# Patient Record
Sex: Female | Born: 1980 | Race: Black or African American | Hispanic: No | Marital: Single | Smoking: Never smoker
Health system: Southern US, Community
[De-identification: ages and names within clinical notes are randomized; demographics above are authoritative.]

## PROBLEM LIST (undated history)

## (undated) DIAGNOSIS — I10 Essential (primary) hypertension: Secondary | ICD-10-CM

## (undated) DIAGNOSIS — N051 Unspecified nephritic syndrome with focal and segmental glomerular lesions: Secondary | ICD-10-CM

## (undated) DIAGNOSIS — E785 Hyperlipidemia, unspecified: Secondary | ICD-10-CM

## (undated) HISTORY — DX: Hyperlipidemia, unspecified: E78.5

## (undated) HISTORY — DX: Essential (primary) hypertension: I10

## (undated) HISTORY — DX: Morbid (severe) obesity due to excess calories: E66.01

## (undated) HISTORY — DX: Unspecified nephritic syndrome with focal and segmental glomerular lesions: N05.1

---
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
## 1998-06-11 ENCOUNTER — Other Ambulatory Visit: Admission: RE | Admit: 1998-06-11 | Discharge: 1998-06-11 | Payer: Self-pay

## 1998-12-31 ENCOUNTER — Encounter: Admission: RE | Admit: 1998-12-31 | Discharge: 1998-12-31 | Payer: Self-pay | Admitting: Internal Medicine

## 1999-01-05 ENCOUNTER — Encounter: Admission: RE | Admit: 1999-01-05 | Discharge: 1999-01-05 | Payer: Self-pay | Admitting: Hematology and Oncology

## 1999-03-18 ENCOUNTER — Encounter: Admission: RE | Admit: 1999-03-18 | Discharge: 1999-03-18 | Payer: Self-pay | Admitting: Internal Medicine

## 1999-07-25 ENCOUNTER — Encounter: Admission: RE | Admit: 1999-07-25 | Discharge: 1999-07-25 | Payer: Self-pay | Admitting: Internal Medicine

## 1999-10-17 ENCOUNTER — Encounter: Admission: RE | Admit: 1999-10-17 | Discharge: 1999-10-17 | Payer: Self-pay | Admitting: Internal Medicine

## 2000-02-07 ENCOUNTER — Encounter: Admission: RE | Admit: 2000-02-07 | Discharge: 2000-02-07 | Payer: Self-pay | Admitting: Hematology and Oncology

## 2000-04-02 ENCOUNTER — Encounter: Admission: RE | Admit: 2000-04-02 | Discharge: 2000-04-02 | Payer: Self-pay | Admitting: Internal Medicine

## 2000-04-23 ENCOUNTER — Encounter: Admission: RE | Admit: 2000-04-23 | Discharge: 2000-04-23 | Payer: Self-pay | Admitting: Internal Medicine

## 2000-05-04 ENCOUNTER — Encounter: Admission: RE | Admit: 2000-05-04 | Discharge: 2000-08-02 | Payer: Self-pay | Admitting: *Deleted

## 2000-08-14 ENCOUNTER — Encounter: Admission: RE | Admit: 2000-08-14 | Discharge: 2000-08-14 | Payer: Self-pay | Admitting: Hematology and Oncology

## 2000-09-24 ENCOUNTER — Encounter: Admission: RE | Admit: 2000-09-24 | Discharge: 2000-09-24 | Payer: Self-pay | Admitting: Hematology and Oncology

## 2000-09-26 ENCOUNTER — Encounter: Admission: RE | Admit: 2000-09-26 | Discharge: 2000-09-26 | Payer: Self-pay | Admitting: Internal Medicine

## 2000-12-02 ENCOUNTER — Encounter: Payer: Self-pay | Admitting: *Deleted

## 2000-12-02 ENCOUNTER — Emergency Department (HOSPITAL_COMMUNITY): Admission: EM | Admit: 2000-12-02 | Discharge: 2000-12-02 | Payer: Self-pay | Admitting: Emergency Medicine

## 2002-01-16 ENCOUNTER — Encounter: Admission: RE | Admit: 2002-01-16 | Discharge: 2002-01-16 | Payer: Self-pay

## 2002-01-20 ENCOUNTER — Encounter: Admission: RE | Admit: 2002-01-20 | Discharge: 2002-01-20 | Payer: Self-pay

## 2002-02-21 ENCOUNTER — Encounter: Admission: RE | Admit: 2002-02-21 | Discharge: 2002-02-21 | Payer: Self-pay | Admitting: Internal Medicine

## 2002-03-10 ENCOUNTER — Encounter: Admission: RE | Admit: 2002-03-10 | Discharge: 2002-06-08 | Payer: Self-pay | Admitting: *Deleted

## 2002-04-17 ENCOUNTER — Encounter: Admission: RE | Admit: 2002-04-17 | Discharge: 2002-04-17 | Payer: Self-pay | Admitting: Internal Medicine

## 2002-08-20 ENCOUNTER — Encounter: Admission: RE | Admit: 2002-08-20 | Discharge: 2002-08-20 | Payer: Self-pay | Admitting: Internal Medicine

## 2002-11-07 ENCOUNTER — Emergency Department (HOSPITAL_COMMUNITY): Admission: EM | Admit: 2002-11-07 | Discharge: 2002-11-07 | Payer: Self-pay | Admitting: Emergency Medicine

## 2003-04-14 ENCOUNTER — Encounter: Admission: RE | Admit: 2003-04-14 | Discharge: 2003-04-14 | Payer: Self-pay | Admitting: Internal Medicine

## 2003-12-23 ENCOUNTER — Encounter: Admission: RE | Admit: 2003-12-23 | Discharge: 2003-12-23 | Payer: Self-pay | Admitting: Internal Medicine

## 2004-01-20 ENCOUNTER — Encounter: Admission: RE | Admit: 2004-01-20 | Discharge: 2004-02-03 | Payer: Self-pay | Admitting: Occupational Medicine

## 2004-02-23 ENCOUNTER — Encounter: Admission: RE | Admit: 2004-02-23 | Discharge: 2004-02-23 | Payer: Self-pay | Admitting: Internal Medicine

## 2004-02-25 ENCOUNTER — Encounter: Admission: RE | Admit: 2004-02-25 | Discharge: 2004-02-25 | Payer: Self-pay | Admitting: Internal Medicine

## 2004-10-03 ENCOUNTER — Ambulatory Visit: Payer: Self-pay | Admitting: Internal Medicine

## 2005-05-17 ENCOUNTER — Ambulatory Visit: Payer: Self-pay | Admitting: Internal Medicine

## 2005-08-11 ENCOUNTER — Ambulatory Visit: Payer: Self-pay | Admitting: Internal Medicine

## 2006-02-14 ENCOUNTER — Ambulatory Visit: Payer: Self-pay | Admitting: Hospitalist

## 2006-08-22 ENCOUNTER — Ambulatory Visit (HOSPITAL_COMMUNITY): Admission: RE | Admit: 2006-08-22 | Discharge: 2006-08-22 | Payer: Self-pay | Admitting: Internal Medicine

## 2006-08-22 ENCOUNTER — Ambulatory Visit: Payer: Self-pay | Admitting: Internal Medicine

## 2006-08-22 ENCOUNTER — Encounter (INDEPENDENT_AMBULATORY_CARE_PROVIDER_SITE_OTHER): Payer: Self-pay | Admitting: Unknown Physician Specialty

## 2006-08-22 LAB — CONVERTED CEMR LAB
BUN: 11 mg/dL (ref 6–23)
CO2: 26 meq/L (ref 19–32)
Calcium: 9.3 mg/dL (ref 8.4–10.5)
Chloride: 105 meq/L (ref 96–112)
Creatinine, Ser: 0.58 mg/dL (ref 0.40–1.20)
Glucose, Bld: 91 mg/dL (ref 70–99)
Potassium: 3.9 meq/L (ref 3.5–5.3)
Sodium: 140 meq/L (ref 135–145)

## 2006-08-29 ENCOUNTER — Encounter (INDEPENDENT_AMBULATORY_CARE_PROVIDER_SITE_OTHER): Payer: Self-pay | Admitting: Unknown Physician Specialty

## 2006-08-29 ENCOUNTER — Ambulatory Visit: Payer: Self-pay | Admitting: Internal Medicine

## 2006-08-29 LAB — CONVERTED CEMR LAB
Creatinine, Urine: 244.9 mg/dL
Microalb Creat Ratio: 126.2 mg/g — ABNORMAL HIGH (ref 0.0–30.0)
Microalb, Ur: 30.9 mg/dL — ABNORMAL HIGH (ref 0.00–1.89)

## 2006-09-26 ENCOUNTER — Encounter: Admission: RE | Admit: 2006-09-26 | Discharge: 2006-09-26 | Payer: Self-pay | Admitting: Unknown Physician Specialty

## 2006-10-03 ENCOUNTER — Ambulatory Visit: Payer: Self-pay | Admitting: Hospitalist

## 2006-10-25 ENCOUNTER — Ambulatory Visit: Payer: Self-pay | Admitting: *Deleted

## 2006-10-25 DIAGNOSIS — E785 Hyperlipidemia, unspecified: Secondary | ICD-10-CM

## 2006-10-25 DIAGNOSIS — I10 Essential (primary) hypertension: Secondary | ICD-10-CM

## 2006-11-22 ENCOUNTER — Ambulatory Visit: Payer: Self-pay | Admitting: Internal Medicine

## 2007-06-19 ENCOUNTER — Encounter (INDEPENDENT_AMBULATORY_CARE_PROVIDER_SITE_OTHER): Payer: Self-pay | Admitting: *Deleted

## 2007-06-19 ENCOUNTER — Ambulatory Visit: Payer: Self-pay | Admitting: Internal Medicine

## 2007-06-21 LAB — CONVERTED CEMR LAB
ALT: 12 units/L (ref 0–35)
AST: 12 units/L (ref 0–37)
Albumin: 3.8 g/dL (ref 3.5–5.2)
Alkaline Phosphatase: 80 units/L (ref 39–117)
BUN: 9 mg/dL (ref 6–23)
CO2: 27 meq/L (ref 19–32)
Calcium: 9.1 mg/dL (ref 8.4–10.5)
Chloride: 103 meq/L (ref 96–112)
Cholesterol: 195 mg/dL (ref 0–200)
Creatinine, Ser: 0.57 mg/dL (ref 0.40–1.20)
Glucose, Bld: 95 mg/dL (ref 70–99)
HCT: 40.2 % (ref 36.0–46.0)
HDL: 46 mg/dL (ref >39)
Hemoglobin: 12.9 g/dL (ref 12.0–15.0)
LDL Cholesterol: 137 mg/dL — ABNORMAL HIGH (ref 0–99)
MCHC: 32.1 g/dL (ref 30.0–36.0)
MCV: 88.2 fL (ref 78.0–100.0)
Platelets: 370 10*3/uL (ref 150–400)
Potassium: 3.9 meq/L (ref 3.5–5.3)
RBC: 4.56 M/uL (ref 3.87–5.11)
RDW: 14.6 % — ABNORMAL HIGH (ref 11.5–14.0)
Sodium: 140 meq/L (ref 135–145)
Total Bilirubin: 0.5 mg/dL (ref 0.3–1.2)
Total CHOL/HDL Ratio: 4.2
Total Protein: 7.1 g/dL (ref 6.0–8.3)
Triglycerides: 58 mg/dL (ref <150)
VLDL: 12 mg/dL (ref 0–40)
WBC: 5 10*3/uL (ref 4.0–10.5)

## 2007-07-03 ENCOUNTER — Encounter (INDEPENDENT_AMBULATORY_CARE_PROVIDER_SITE_OTHER): Payer: Self-pay | Admitting: *Deleted

## 2007-07-03 ENCOUNTER — Ambulatory Visit: Payer: Self-pay | Admitting: Internal Medicine

## 2007-07-08 LAB — CONVERTED CEMR LAB
Creatinine, Urine: 140.2 mg/dL
Microalb Creat Ratio: 104.9 mg/g — ABNORMAL HIGH (ref 0.0–30.0)
Microalb, Ur: 14.7 mg/dL — ABNORMAL HIGH (ref 0.00–1.89)

## 2007-11-16 IMAGING — CR DG THORACIC SPINE 2V
4 series · 4 of 4 positions shown · non-contrast
Comparison: none

CLINICAL DATA: Injured back on job lifting patient. 
 THORACIC SPINE ? 2 VIEW:

[view not recorded (1 of 4)]
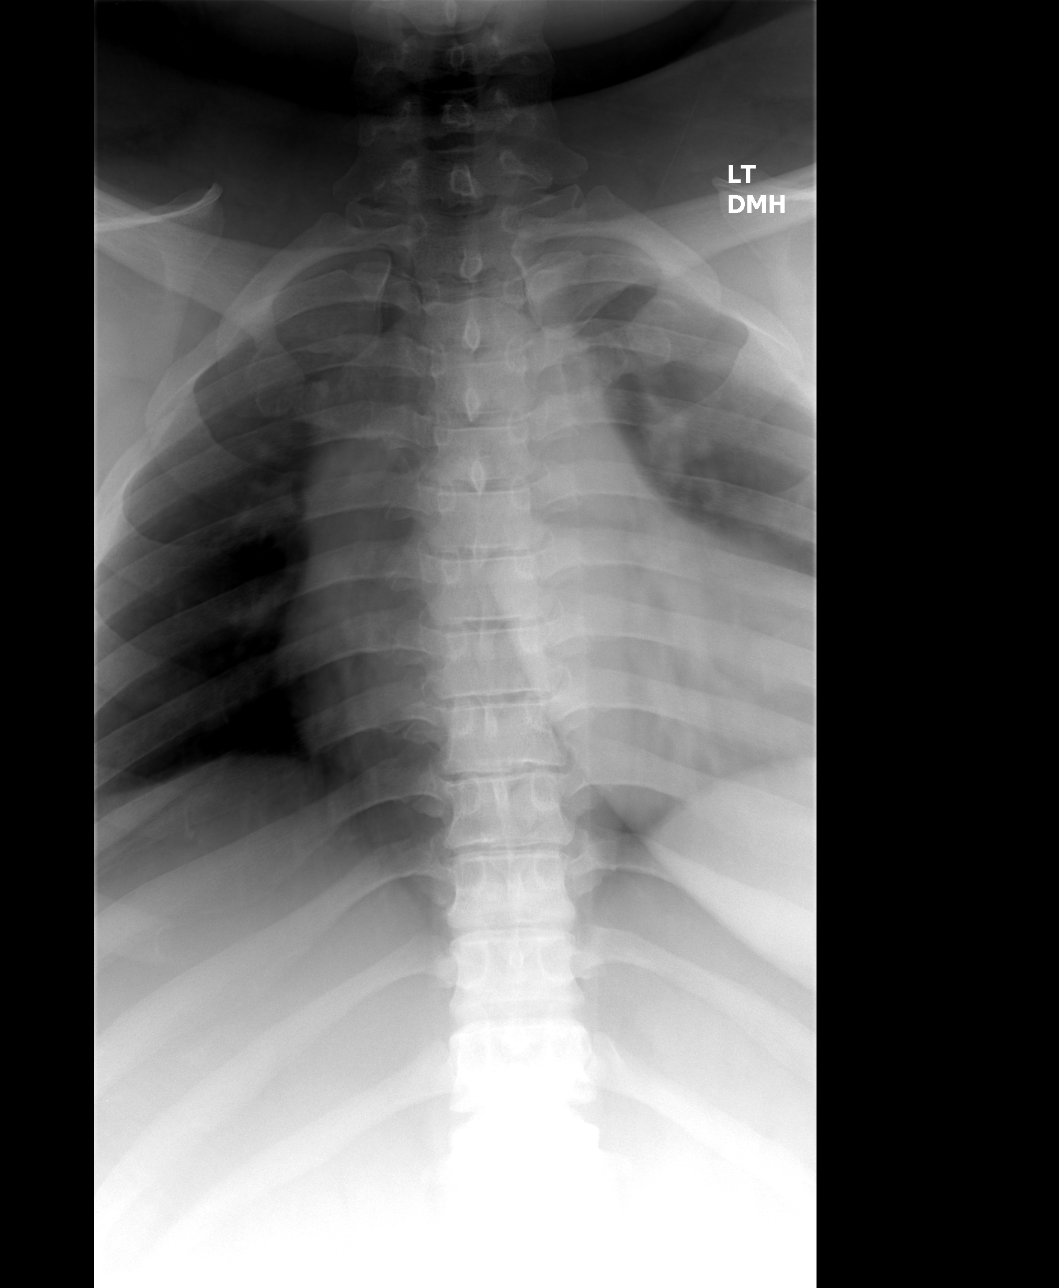

[view not recorded (2 of 4)]
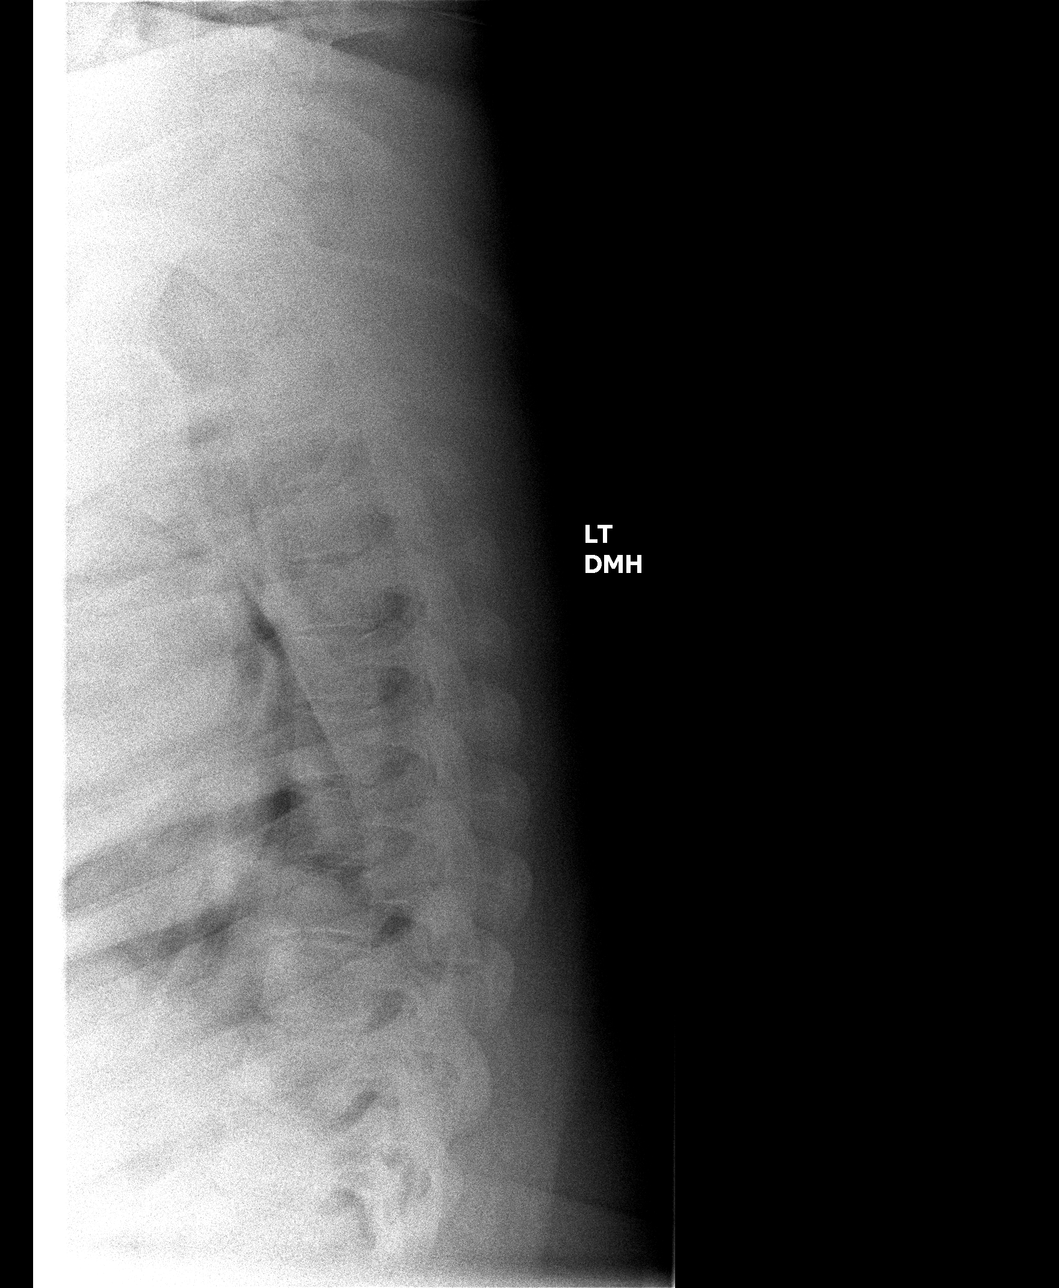

[view not recorded (3 of 4)]
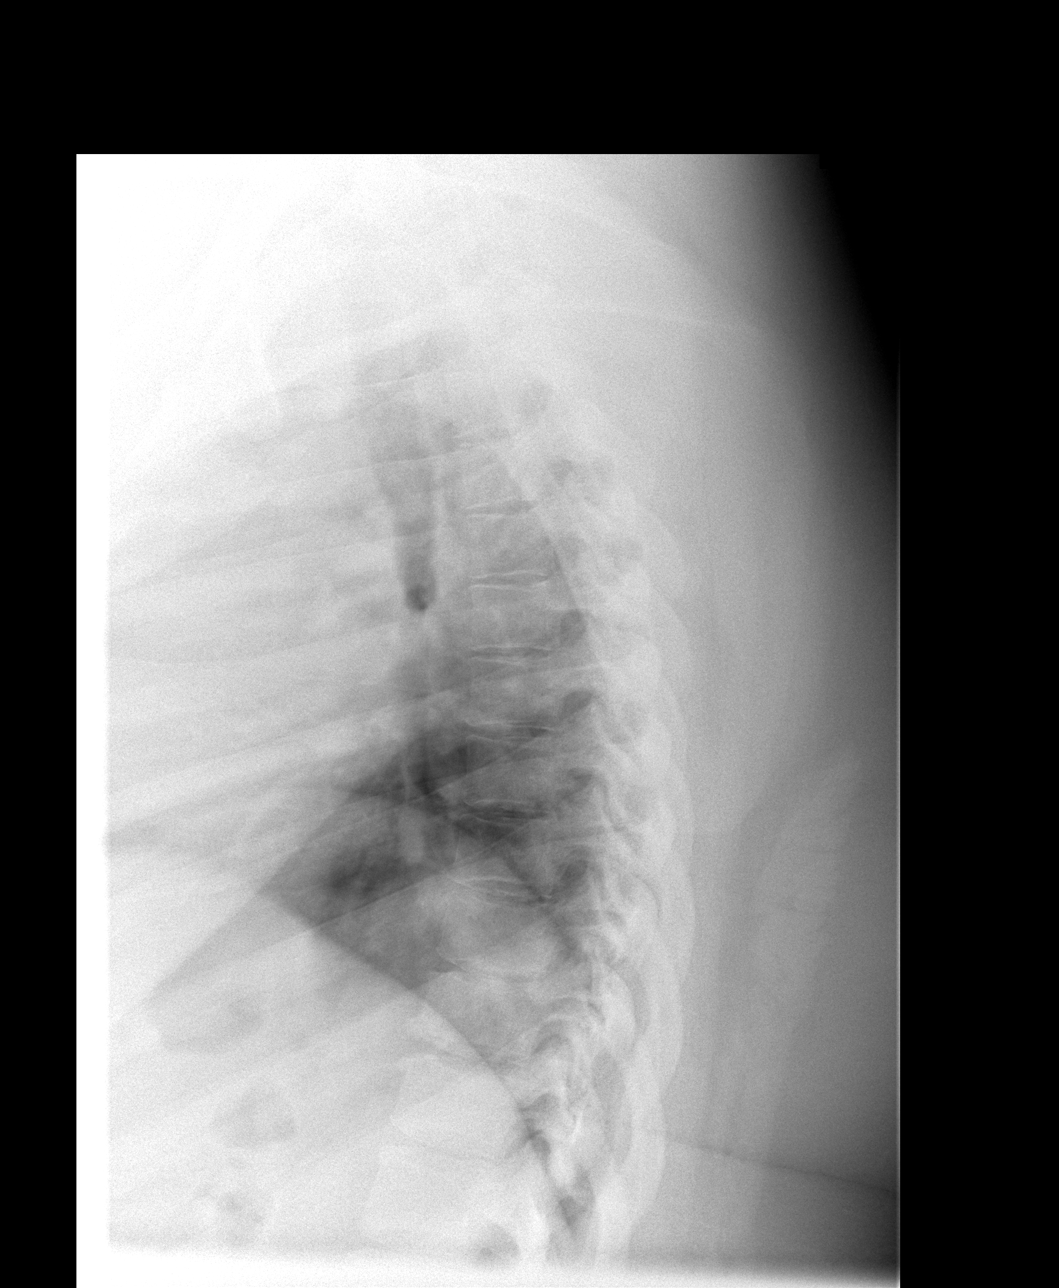

[view not recorded (4 of 4)]
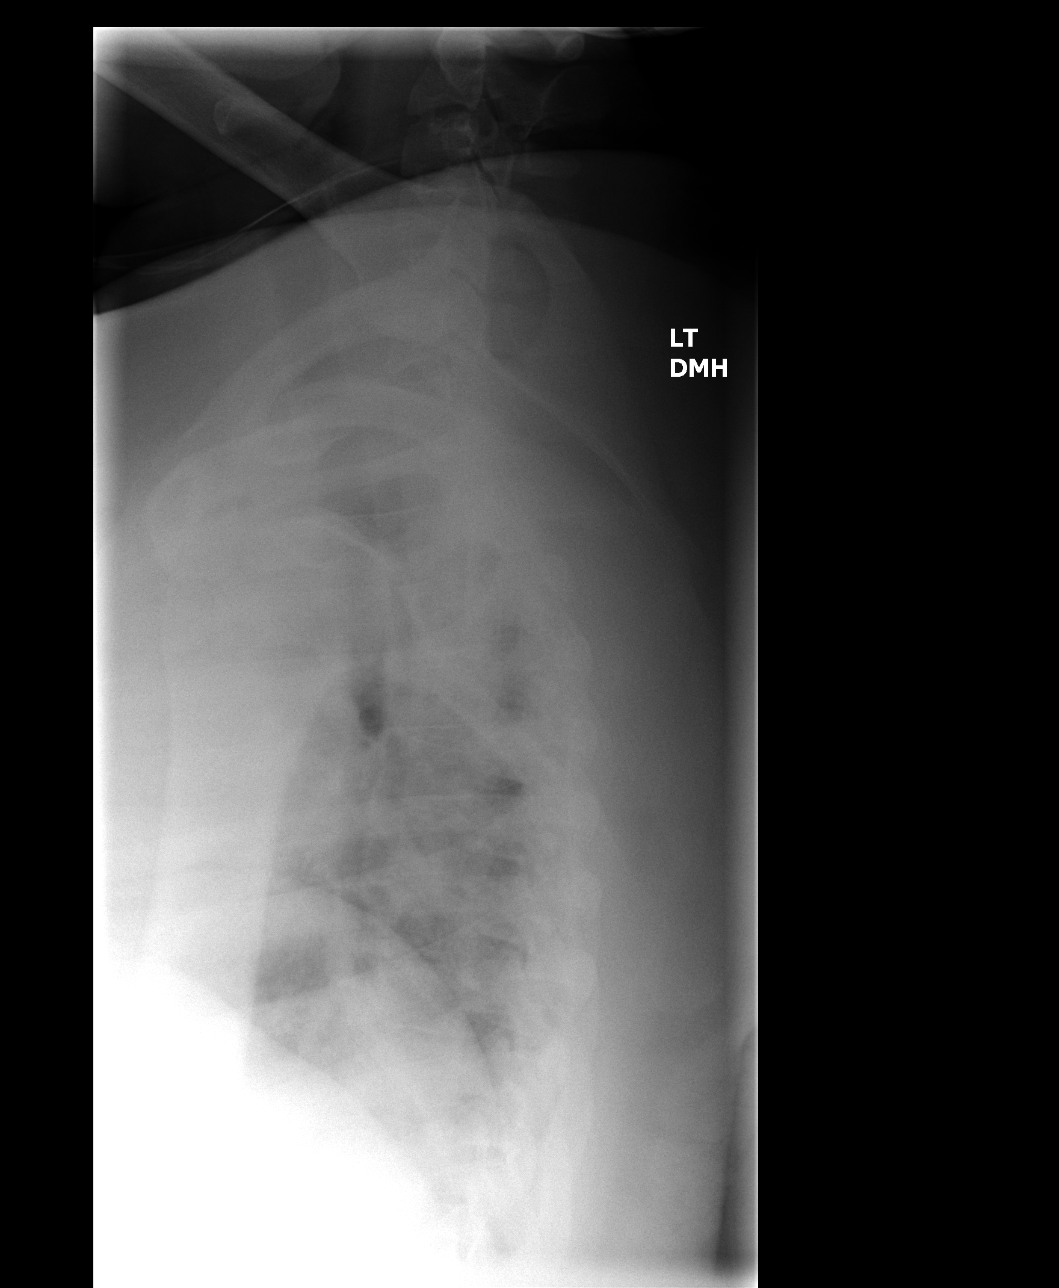

[4 of 4 positions shown; findings below may reference images not displayed]

FINDINGS: The cervicothoracic junction, despite the swimmer?s view is not well penetrated.  As a result, this area is not well visualized on the lateral projection.  There is no evidence for fracture or subluxation.    There is no evidence for paraspinal soft tissue swelling.
IMPRESSION: Negative study with suboptimal visualization of the upper thoracic spine and cervicothoracic junction in the lateral projection.

## 2008-01-14 ENCOUNTER — Telehealth: Payer: Self-pay | Admitting: *Deleted

## 2008-01-17 ENCOUNTER — Ambulatory Visit: Payer: Self-pay | Admitting: Hospitalist

## 2008-01-17 ENCOUNTER — Encounter (INDEPENDENT_AMBULATORY_CARE_PROVIDER_SITE_OTHER): Payer: Self-pay | Admitting: Internal Medicine

## 2008-01-17 LAB — CONVERTED CEMR LAB
BUN: 7 mg/dL (ref 6–23)
CO2: 26 meq/L (ref 19–32)
Calcium: 9.2 mg/dL (ref 8.4–10.5)
Chloride: 106 meq/L (ref 96–112)
Creatinine, Ser: 0.53 mg/dL (ref 0.40–1.20)
Creatinine, Urine: 140.3 mg/dL
Glucose, Bld: 92 mg/dL (ref 70–99)
Microalb Creat Ratio: 68.8 mg/g — ABNORMAL HIGH (ref 0.0–30.0)
Microalb, Ur: 9.65 mg/dL — ABNORMAL HIGH (ref 0.00–1.89)
Potassium: 3.8 meq/L (ref 3.5–5.3)
Sodium: 138 meq/L (ref 135–145)

## 2008-05-27 ENCOUNTER — Encounter (INDEPENDENT_AMBULATORY_CARE_PROVIDER_SITE_OTHER): Payer: Self-pay | Admitting: Internal Medicine

## 2008-05-27 ENCOUNTER — Ambulatory Visit: Payer: Self-pay | Admitting: Internal Medicine

## 2008-06-03 ENCOUNTER — Ambulatory Visit: Payer: Self-pay | Admitting: Internal Medicine

## 2008-06-03 LAB — CONVERTED CEMR LAB
Basophils Absolute: 0 10*3/uL (ref 0.0–0.1)
Basophils Relative: 1 % (ref 0–1)
Eosinophils Absolute: 0.1 10*3/uL (ref 0.0–0.7)
Eosinophils Relative: 2 % (ref 0–5)
HCT: 40.7 % (ref 36.0–46.0)
Hemoglobin: 13.9 g/dL (ref 12.0–15.0)
Lymphocytes Relative: 36 % (ref 12–46)
Lymphs Abs: 1 10*3/uL (ref 0.7–4.0)
MCHC: 34.2 g/dL (ref 30.0–36.0)
MCV: 82.1 fL (ref 78.0–100.0)
Monocytes Absolute: 0.5 10*3/uL (ref 0.1–1.0)
Monocytes Relative: 17 % — ABNORMAL HIGH (ref 3–12)
Neutro Abs: 1.3 10*3/uL — ABNORMAL LOW (ref 1.7–7.7)
Neutrophils Relative %: 44 % (ref 43–77)
Platelets: 365 10*3/uL (ref 150–400)
RBC: 4.96 M/uL (ref 3.87–5.11)
RDW: 14 % (ref 11.5–15.5)
WBC: 2.9 10*3/uL — ABNORMAL LOW (ref 4.0–10.5)

## 2008-06-04 ENCOUNTER — Encounter (INDEPENDENT_AMBULATORY_CARE_PROVIDER_SITE_OTHER): Payer: Self-pay | Admitting: Internal Medicine

## 2008-06-15 ENCOUNTER — Telehealth: Payer: Self-pay | Admitting: *Deleted

## 2008-12-17 ENCOUNTER — Encounter (INDEPENDENT_AMBULATORY_CARE_PROVIDER_SITE_OTHER): Payer: Self-pay | Admitting: Internal Medicine

## 2008-12-17 ENCOUNTER — Ambulatory Visit: Payer: Self-pay | Admitting: *Deleted

## 2008-12-21 LAB — CONVERTED CEMR LAB
ALT: 14 units/L (ref 0–35)
AST: 12 units/L (ref 0–37)
Albumin: 3.7 g/dL (ref 3.5–5.2)
Alkaline Phosphatase: 91 units/L (ref 39–117)
BUN: 11 mg/dL (ref 6–23)
CO2: 24 meq/L (ref 19–32)
Calcium: 9.2 mg/dL (ref 8.4–10.5)
Chloride: 104 meq/L (ref 96–112)
Cholesterol: 187 mg/dL (ref 0–200)
Creatinine, Ser: 0.5 mg/dL (ref 0.40–1.20)
Glucose, Bld: 88 mg/dL (ref 70–99)
HCT: 40 % (ref 36.0–46.0)
HDL: 47 mg/dL (ref >39)
Hemoglobin: 13.2 g/dL (ref 12.0–15.0)
LDL Cholesterol: 128 mg/dL — ABNORMAL HIGH (ref 0–99)
MCHC: 33 g/dL (ref 30.0–36.0)
MCV: 84.6 fL (ref 78.0–100.0)
Platelets: 394 10*3/uL (ref 150–400)
Potassium: 4.2 meq/L (ref 3.5–5.3)
RBC: 4.73 M/uL (ref 3.87–5.11)
RDW: 13.4 % (ref 11.5–15.5)
Sodium: 141 meq/L (ref 135–145)
Total Bilirubin: 0.4 mg/dL (ref 0.3–1.2)
Total CHOL/HDL Ratio: 4
Total Protein: 7.4 g/dL (ref 6.0–8.3)
Triglycerides: 60 mg/dL (ref <150)
VLDL: 12 mg/dL (ref 0–40)
WBC: 5.4 10*3/uL (ref 4.0–10.5)

## 2009-04-05 ENCOUNTER — Telehealth: Payer: Self-pay | Admitting: Infectious Disease

## 2010-02-22 ENCOUNTER — Ambulatory Visit: Payer: Self-pay | Admitting: Internal Medicine

## 2010-02-22 LAB — CONVERTED CEMR LAB
AST: 13 units/L (ref 0–37)
Albumin: 4.2 g/dL (ref 3.5–5.2)
BUN: 12 mg/dL (ref 6–23)
Calcium: 9.2 mg/dL (ref 8.4–10.5)
Casts: NONE SEEN /lpf
Chloride: 105 meq/L (ref 96–112)
Creatinine, Urine: 315 mg/dL
Glucose, Bld: 97 mg/dL (ref 70–99)
HDL: 37 mg/dL — ABNORMAL LOW (ref >39)
Ketones, ur: NEGATIVE mg/dL
Nitrite: NEGATIVE
Potassium: 3.9 meq/L (ref 3.5–5.3)
Specific Gravity, Urine: 1.03 (ref 1.005–1.0)
TSH: 0.922 microintl units/mL (ref 0.350–4.5)
pH: 5.5 (ref 5.0–8.0)

## 2010-05-09 ENCOUNTER — Telehealth: Payer: Self-pay | Admitting: Internal Medicine

## 2010-11-22 ENCOUNTER — Ambulatory Visit: Admit: 2010-11-22 | Payer: Self-pay

## 2010-12-13 NOTE — Assessment & Plan Note (Signed)
Summary: EST-CK/FU/MEDS/CFB   Vital Signs:  Patient profile:   30 year old female Height:      66 inches (167.64 cm) Weight:      334.8 pounds (152.18 kg) BMI:     54.23 Temp:     99.1 degrees F (37.28 degrees C) oral Pulse rate:   99 / minute BP sitting:   137 / 93  (left arm)  Vitals Entered By: Blenda Mounts (February 22, 2010 8:48 AM)//Glenda Rubye Oaks RN CC: follow-up visit Is Patient Diabetic? No Pain Assessment Patient in pain? no      Nutritional Status BMI of > 30 = obese  Have you ever been in a relationship where you felt threatened, hurt or afraid?No   Does patient need assistance? Functional Status Self care Ambulation Normal   CC:  follow-up visit.  History of Present Illness: 21 yof with pmh outlined in this chart here for a check up.  She has no complaints.  She continues to work and go to school  She lives with her parents. Does not smoke, drink or use drugs.  She has regular periods and is not sexually active.  She walks 3 days a week, but not for 2 weeks because of rain.   Depression History:      The patient denies a depressed mood most of the day and a diminished interest in her usual daily activities.         Preventive Screening-Counseling & Management  Alcohol-Tobacco     Alcohol drinks/day: 0     Smoking Status: never  Caffeine-Diet-Exercise     Does Patient Exercise: yes     Type of exercise: walk     Times/week: 3  Current Medications (verified): 1)  Hydrochlorothiazide 25 Mg Tabs (Hydrochlorothiazide) .... Take 1 Tablet By Mouth Once A Day 2)  Vasotec 20 Mg  Tabs (Enalapril Maleate) .... 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   Head:  normocephalic and atraumatic.   Eyes:  vision grossly intact, pupils equal, pupils round, and pupils reactive to light.   Mouth:  good dentition and pharynx pink and moist.   Lungs:  normal respiratory effort and normal breath sounds.   Heart:  normal rate, regular rhythm, and no murmur.   Pulses:  2+ Extremities:  no edema Neurologic:  alert & oriented X3, cranial nerves II-XII intact, and gait normal.   Skin:  actanthosis, hirsut Cervical Nodes:  no anterior cervical adenopathy and no posterior cervical adenopathy.   Psych:  Oriented X3, memory intact for recent and remote, normally interactive, good eye contact, and flat affect.     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  No edema.  Orders: T-Urinalysis ([id number]) T-Urine Microalbumin w/creat. ratio [phone])  Problem # 2:  MORBID OBESITY (ICD-278.01) She knows she needs to lose weight and seems to be trying to do so by walking and eating well.  Encouraged her to walk more frequently and increase intensity and duration of exercise.  Encouraged continuation of healthy diet.    Orders: T-Hgb A1C (  in-house) [phone]) T- Capillary Blood Glucose (11914) T-TSH ([id number])  Problem # 3:  HYPERTENSION (ICD-401.9) BP is OK today.  No changes.  Her updated medication list for this problem includes:    Hydrochlorothiazide 25 Mg Tabs (Hydrochlorothiazide) .Marland Kitchen... Take 1 tablet by mouth once a day    Vasotec 20 Mg Tabs (Enalapril maleate) .Marland Kitchen... Take 2 tablets by mouth once a day  BP today: 137/93 Prior BP: 132/85 (12/17/2008)  Labs Reviewed: K+: 4.2 (12/17/2008) Creat: : 0.50 (12/17/2008)   Chol: 187 (12/17/2008)   HDL: 47 (12/17/2008)   LDL: 128 (12/17/2008)   TG: 60 (12/17/2008)  Problem # 4:  HYPERLIPIDEMIA (ICD-272.4) Is not taking  pravachol.  Does not remember that it was ever prescribed.  Will recheck lipids.  The following medications were removed from the medication list:    Pravachol 40 Mg Tabs (Pravastatin sodium) .Marland Kitchen... Take one tablet daily.  Labs Reviewed: SGOT: 12 (12/17/2008)   SGPT: 14 (12/17/2008)   HDL:47 (12/17/2008), 46 (06/19/2007)  LDL:128 (12/17/2008), 137 (06/19/2007)  Chol:187 (12/17/2008), 195 (06/19/2007)  Trig:60 (12/17/2008), 58 (06/19/2007)  Orders: T-Lipid Profile ([id number]) T-Comprehensive Metabolic Panel ([id number])  Problem # 5:  Preventive Health Care (ICD-V70.0) declined pap smear. says that she had one once and it was too painful.  is not sexually active.  encouraged her to make an appointment for this screening exam in the next few months.  Complete Medication List: 1)  Hydrochlorothiazide 25 Mg Tabs (Hydrochlorothiazide) .... Take 1 tablet by mouth once a day 2)  Vasotec 20 Mg Tabs (Enalapril maleate) .... Take 2 tablets by mouth once a day  Patient Instructions: 1)  You had labwork today, we will call you to discuss results. 2)  You need a pap smear.  Please schedule an appointment in the next few months for this. 3)  You should continue to exercise and maintain a healthy diet to lose weight. Process Orders Check Orders Results:     [facility]: ABN not required for this insurance Tests Sent for requisitioning (February 22, 2010 9:16 AM):     02/22/2010: [facility] -- T-Urinalysis [[id number]] (signed)     02/22/2010: [facility] -- T-Urine Microalbumin w/creat. ratio [[id number]] (signed)     02/22/2010: [facility] -- T-Lipid Profile [phone] (signed)     02/22/2010: [facility] -- T-Comprehensive Metabolic Panel [[id number]] (signed)     02/22/2010: [facility] -- T-TSH [phone] (signed)    Prevention & Chronic Care Immunizations   Influenza vaccine: Not  documented    Tetanus booster: Not documented    Pneumococcal vaccine: Not documented  Other Screening   Pap smear: Not documented   Smoking status: never  (02/22/2010)  Lipids   Total Cholesterol: 187  (12/17/2008)   LDL: 128  (12/17/2008)   LDL Direct: Not documented   HDL: 47  (12/17/2008)   Triglycerides: 60  (12/17/2008)    SGOT (AST): 12  (12/17/2008)   SGPT (ALT): 14  (12/17/2008) CMP ordered    Alkaline phosphatase: 91  (12/17/2008)   Total bilirubin: 0.4  (12/17/2008)    Lipid flowsheet reviewed?: Yes   Progress toward LDL goal: Unchanged  Hypertension   Last Blood Pressure: 137 / 93  (02/22/2010)   Serum creatinine: 0.50  (12/17/2008)   Serum potassium 4.2  (12/17/2008) CMP ordered     Hypertension flowsheet reviewed?: Yes   Progress toward BP goal: Unchanged  Self-Management Support :    Patient will work on the following items  until the next clinic visit to reach self-care goals:     Medications and monitoring: take my medicines every day, check my blood pressure  (02/22/2010)     Eating: eat more vegetables, use fresh or frozen vegetables, eat foods that are low in salt, eat baked foods instead of fried foods  (02/22/2010)     Activity: take a 30 minute walk every day  (02/22/2010)    Hypertension self-management support: Written self-care plan, Education handout, Resources for patients handout  (02/22/2010)   Hypertension self-care plan printed.   Hypertension education handout printed    Lipid self-management support: Written self-care plan, Resources for patients handout  (02/22/2010)   Lipid self-care plan printed.      Resource handout printed.   Appended Document: Lab Order/a1c results    Lab Visit  Laboratory Results   Blood Tests   Date/Time Received: February 22, 2010 9:59 AM Date/Time Reported: Alric Quan  February 22, 2010 9:59 AM  HGBA1C: 5.6%   (Normal Range: Non-Diabetic - 3-6%   Control Diabetic - 6-8%) CBG Random::  94mg /dL    Orders Today:

## 2010-12-13 NOTE — Progress Notes (Signed)
Summary: refill/gg  Phone Note Refill Request  on May 09, 2010 3:21 PM  Refills Requested: Medication #1:  HYDROCHLOROTHIAZIDE 25 MG TABS Take 1 tablet by mouth once a day  Medication #2:  VASOTEC 20 MG  TABS Take 2 tablets by mouth once a day.  Method Requested: Electronic Initial call taken by: Merrie Roof RN,  May 09, 2010 3:21 PM  Follow-up for Phone Call        Refill approved Follow-up by: Bethel Born MD,  May 09, 2010 5:12 PM    Prescriptions: VASOTEC 20 MG  TABS (ENALAPRIL MALEATE) Take 2 tablets by mouth once a day  #31 x 12   Entered and Authorized by:   Bethel Born MD   Signed by:   Bethel Born MD on 05/09/2010   Method used:   Electronically to        [facility] (retail)       [address]       [place], [place]  [address]       Ph: [phone]       Fax: [phone]   RxID:   [id number] HYDROCHLOROTHIAZIDE 25 MG TABS (HYDROCHLOROTHIAZIDE) Take 1 tablet by mouth once a day  #31 x 12   Entered and Authorized by:   Bethel Born MD   Signed by:   Bethel Born MD on 05/09/2010   Method used:   Electronically to        [facility] (retail)       [address]       [place], [place]  [address]       Ph: [phone]       Fax: [phone]   RxID:   [id number]

## 2010-12-27 ENCOUNTER — Encounter: Payer: Self-pay | Admitting: Internal Medicine

## 2011-03-21 ENCOUNTER — Encounter: Payer: Self-pay | Admitting: Internal Medicine

## 2011-03-21 ENCOUNTER — Ambulatory Visit (INDEPENDENT_AMBULATORY_CARE_PROVIDER_SITE_OTHER): Payer: Self-pay | Admitting: Internal Medicine

## 2011-03-21 VITALS — BP 116/81 | HR 89 | Temp 99.3°F | Ht 65.0 in | Wt 340.2 lb

## 2011-03-21 DIAGNOSIS — E785 Hyperlipidemia, unspecified: Secondary | ICD-10-CM

## 2011-03-21 DIAGNOSIS — I10 Essential (primary) hypertension: Secondary | ICD-10-CM

## 2011-03-21 LAB — LIPID PANEL
Cholesterol: 183 mg/dL (ref 0–200)
HDL: 40 mg/dL (ref >39)
LDL Cholesterol: 125 mg/dL — ABNORMAL HIGH (ref 0–99)
Total CHOL/HDL Ratio: 4.6 Ratio
Triglycerides: 90 mg/dL (ref <150)
VLDL: 18 mg/dL (ref 0–40)

## 2011-03-21 NOTE — Assessment & Plan Note (Addendum)
Well-controlled. No symptoms of hypotension. Compliant with her medications. We'll check creatinine clearance today. Followup in 6-8 months She does have a significant proteinuria, will try to get records from her renal doctor. Continue ACEi for now

## 2011-03-21 NOTE — Progress Notes (Signed)
30 year old woman with history of hypertension and Obesity comes to the clinic for followup  No complaints or concerns today. His compliant with her blood pressure medications. He is working on weight reduction with exercise and diet control.  BP 116/81  Pulse 89  Temp(Src) 99.3 F (37.4 C) (Oral)  Ht 5\' 5"  (1.651 m)  Wt 340 lb 3.2 oz (154.314 kg)  BMI 56.61 kg/m2  SpO2 100%  LMP 03/20/2011  General Appearance:    Alert, cooperative, no distress, appears stated age  Head:    Normocephalic, without obvious abnormality, atraumatic  Eyes:    PERRL, conjunctiva/corneas clear, EOM's intact, fundi    benign, both eyes  Ears:    Normal TM's and external ear canals, both ears  Nose:   Nares normal, septum midline, mucosa normal, no drainage    or sinus tenderness  Throat:   Lips, mucosa, and tongue normal; teeth and gums normal  Neck:   Supple, symmetrical, trachea midline, no adenopathy;    thyroid:  no enlargement/tenderness/nodules; no carotid   bruit or JVD  Back:     Symmetric, no curvature, ROM normal, no CVA tenderness  Lungs:     Clear to auscultation bilaterally, respirations unlabored  Chest Wall:    No tenderness or deformity   Heart:    Regular rate and rhythm, S1 and S2 normal, no murmur, rub   or gallop     Abdomen:     Soft, non-tender, bowel sounds active all four quadrants,    no masses, no organomegaly        Extremities:   Extremities normal, atraumatic, no cyanosis or edema  Pulses:   2+ and symmetric all extremities  Skin:   Skin color, texture, turgor normal, no rashes or lesions  Lymph nodes:   Cervical, supraclavicular, and axillary nodes normal  Neurologic:   CNII-XII intact, normal strength, sensation and reflexes    throughout   ROS  Constitutional: Denies fever, chills, diaphoresis, appetite change and fatigue.  HEENT: Denies photophobia, eye pain, redness, hearing loss, ear pain, congestion, sore throat, rhinorrhea, sneezing, mouth sores, trouble  swallowing, neck pain, neck stiffness and tinnitus.   Respiratory: Denies SOB, DOE, cough, chest tightness,  and wheezing.   Cardiovascular: Denies chest pain, palpitations and leg swelling.  Gastrointestinal: Denies nausea, vomiting, abdominal pain, diarrhea, constipation, blood in stool and abdominal distention.  Genitourinary: Denies dysuria, urgency, frequency, hematuria, flank pain and difficulty urinating.  Musculoskeletal: Denies myalgias, back pain, joint swelling, arthralgias and gait problem.  Skin: Denies pallor, rash and wound.  Neurological: Denies dizziness, seizures, syncope, weakness, light-headedness, numbness and headaches.  Hematological: Denies adenopathy. Easy bruising, personal or family bleeding history  Psychiatric/Behavioral: Denies suicidal ideation, mood changes, confusion, nervousness, sleep disturbance and agitation

## 2011-03-21 NOTE — Assessment & Plan Note (Signed)
Currently not on any medications. Recheck lipid profile and liver function test.

## 2011-03-21 NOTE — Patient Instructions (Signed)
Follow up in 6-8 months

## 2011-03-22 LAB — COMPLETE METABOLIC PANEL WITH GFR
BUN: 10 mg/dL (ref 6–23)
CO2: 23 mEq/L (ref 19–32)
Calcium: 9.5 mg/dL (ref 8.4–10.5)
Chloride: 102 mEq/L (ref 96–112)
Creat: 0.47 mg/dL (ref 0.40–1.20)
GFR, Est African American: >60 mL/min (ref >60)
GFR, Est Non African American: >60 mL/min (ref >60)
Glucose, Bld: 96 mg/dL (ref 70–99)

## 2011-05-26 ENCOUNTER — Other Ambulatory Visit: Payer: Self-pay | Admitting: Internal Medicine

## 2011-08-24 ENCOUNTER — Encounter: Payer: Self-pay | Admitting: Internal Medicine

## 2011-08-24 ENCOUNTER — Ambulatory Visit (INDEPENDENT_AMBULATORY_CARE_PROVIDER_SITE_OTHER): Payer: Self-pay | Admitting: Internal Medicine

## 2011-08-24 DIAGNOSIS — I10 Essential (primary) hypertension: Secondary | ICD-10-CM

## 2011-08-24 DIAGNOSIS — E785 Hyperlipidemia, unspecified: Secondary | ICD-10-CM

## 2011-08-24 NOTE — Patient Instructions (Signed)
Follow up in 1 year. Come fasting at that time.

## 2011-08-24 NOTE — Assessment & Plan Note (Signed)
Explained importance of weight loss and some ways to achieve that. Patient cites understanding.

## 2011-08-24 NOTE — Progress Notes (Signed)
30 year old woman with history of hypertension and Obesity comes to the clinic for followup  No complaints or concerns today.  His compliant with her blood pressure medications.  He is working on weight reduction with exercise and diet control.  Physical exam  BP 129/86  Pulse 81  Temp(Src) 99 F (37.2 C) (Oral)  Ht 5\' 6"  (1.676 m)  Wt 344 lb 14.4 oz (156.446 kg)  BMI 55.67 kg/m2  LMP 08/20/2011  General Appearance:    Alert, cooperative, no distress, appears stated age, obese   Head:    Normocephalic, without obvious abnormality, atraumatic  Eyes:    PERRL, conjunctiva/corneas clear, EOM's intact, fundi    benign, both eyes       Ears:    Normal TM's and external ear canals, both ears  Nose:   Nares normal, septum midline, mucosa normal, no drainage   or sinus tenderness  Throat:   Lips, mucosa, and tongue normal; teeth and gums normal  Neck:   Supple, symmetrical, trachea midline, no adenopathy;       thyroid:  No enlargement/tenderness/nodules; no carotid   bruit or JVD  Back:     Symmetric, no curvature, ROM normal, no CVA tenderness  Lungs:     Clear to auscultation bilaterally, respirations unlabored  Chest wall:    No tenderness or deformity  Heart:    Regular rate and rhythm, S1 and S2 normal, no murmur, rub   or gallop  Abdomen:     Soft, non-tender, bowel sounds active all four quadrants,    no masses, no organomegaly  Extremities:   Extremities normal, atraumatic, no cyanosis or edema  Pulses:   2+ and symmetric all extremities  Skin:   Skin color, texture, turgor normal, no rashes or lesions  Lymph nodes:   Cervical, supraclavicular, and axillary nodes normal  Neurologic:   CNII-XII intact. Normal strength, sensation and reflexes      throughout   Review of systems  Constitutional: Denies fever, chills, diaphoresis, appetite change and fatigue.  HEENT: Denies photophobia, eye pain, redness, hearing loss, ear pain, congestion, sore throat, rhinorrhea, sneezing,  mouth sores, trouble swallowing, neck pain, neck stiffness and tinnitus.   Respiratory: Denies SOB, DOE, cough, chest tightness,  and wheezing.   Cardiovascular: Denies chest pain, palpitations and leg swelling.  Gastrointestinal: Denies nausea, vomiting, abdominal pain, diarrhea, constipation, blood in stool and abdominal distention.  Genitourinary: Denies dysuria, urgency, frequency, hematuria, flank pain and difficulty urinating.  Musculoskeletal: Denies myalgias, back pain, joint swelling, arthralgias and gait problem.  Skin: Denies pallor, rash and wound.  Neurological: Denies dizziness, seizures, syncope, weakness, light-headedness, numbness and headaches.  Hematological: Denies adenopathy. Easy bruising, personal or family bleeding history  Psychiatric/Behavioral: Denies suicidal ideation, mood changes, confusion, nervousness, sleep disturbance and agitation

## 2011-08-24 NOTE — Assessment & Plan Note (Signed)
Patient has been working very hard to control her diet and lose weight. She is successful sometimes within she regains the weight. He does not want to be on an extra medication at this time. Given the only risk factor of hypertension in this young woman, her LDL goal is less than 160. Continue to follow once a year.

## 2011-08-24 NOTE — Assessment & Plan Note (Signed)
Rate control. Continue to follow. Check urine microalbumin/creatinine next visit. If still high increase lisinopril to 40 mg once a day.

## 2011-11-10 ENCOUNTER — Other Ambulatory Visit: Payer: Self-pay | Admitting: Internal Medicine

## 2011-11-10 DIAGNOSIS — E119 Type 2 diabetes mellitus without complications: Secondary | ICD-10-CM

## 2011-11-13 ENCOUNTER — Encounter (HOSPITAL_COMMUNITY): Payer: Self-pay | Admitting: *Deleted

## 2011-11-13 ENCOUNTER — Emergency Department (HOSPITAL_COMMUNITY)
Admission: EM | Admit: 2011-11-13 | Discharge: 2011-11-14 | Disposition: A | Discharge status: Home / Self Care | Payer: Self-pay | Attending: Emergency Medicine | Admitting: Emergency Medicine

## 2011-11-13 DIAGNOSIS — R1013 Epigastric pain: Secondary | ICD-10-CM | POA: Insufficient documentation

## 2011-11-13 DIAGNOSIS — Z79899 Other long term (current) drug therapy: Secondary | ICD-10-CM | POA: Insufficient documentation

## 2011-11-13 DIAGNOSIS — R110 Nausea: Secondary | ICD-10-CM | POA: Insufficient documentation

## 2011-11-13 DIAGNOSIS — I10 Essential (primary) hypertension: Secondary | ICD-10-CM | POA: Insufficient documentation

## 2011-11-13 DIAGNOSIS — K2970 Gastritis, unspecified, without bleeding: Secondary | ICD-10-CM | POA: Insufficient documentation

## 2011-11-13 DIAGNOSIS — E785 Hyperlipidemia, unspecified: Secondary | ICD-10-CM | POA: Insufficient documentation

## 2011-11-13 LAB — CBC
HCT: 37.4 % (ref 36.0–46.0)
MCHC: 34 g/dL (ref 30.0–36.0)
MCV: 82.4 fL (ref 78.0–100.0)
Platelets: 378 10*3/uL (ref 150–400)
RDW: 13.7 % (ref 11.5–15.5)

## 2011-11-13 LAB — URINALYSIS, ROUTINE W REFLEX MICROSCOPIC
Bilirubin Urine: NEGATIVE
Hgb urine dipstick: NEGATIVE
Nitrite: NEGATIVE
Specific Gravity, Urine: 1.016 (ref 1.005–1.030)
Urobilinogen, UA: 1 mg/dL (ref 0.0–1.0)
pH: 6 (ref 5.0–8.0)

## 2011-11-13 LAB — URINE MICROSCOPIC-ADD ON

## 2011-11-13 LAB — DIFFERENTIAL
Basophils Absolute: 0 10*3/uL (ref 0.0–0.1)
Basophils Relative: 0 % (ref 0–1)
Eosinophils Absolute: 0.1 10*3/uL (ref 0.0–0.7)
Eosinophils Relative: 1 % (ref 0–5)
Monocytes Absolute: 0.7 10*3/uL (ref 0.1–1.0)

## 2011-11-13 LAB — POCT PREGNANCY, URINE: Preg Test, Ur: NEGATIVE

## 2011-11-13 MED ORDER — GI COCKTAIL ~~LOC~~
30.0000 mL | Freq: Once | ORAL | Status: AC
  Administered 2011-11-13: 30 mL via ORAL
  Filled 2011-11-13: qty 30

## 2011-11-13 NOTE — ED Notes (Signed)
abd  Pain with nausea since dec 25th.  She is also c/o chills no temp

## 2011-11-13 NOTE — ED Provider Notes (Signed)
History     CSN: [id number]  Arrival date & time 11/13/11  2009   First MD Initiated Contact with Patient 11/13/11 2223      Chief Complaint  Patient presents with  . Abdominal Pain    (Consider location/radiation/quality/duration/timing/severity/associated sxs/prior treatment) HPI History provided by pt.   Pt c/o intermittent, non-radiating epigastric pain x 1 week.  First noticed it approx 1 hr after eating, but has not seemed to be associated w/ eating since then.  Associated w/ nausea.  Denies fever, vomiting, diarrhea, GU sx, cough and SOB.  Sx aggravated by movement and seem to improve when she lays down.  Has never had pain like this before.  No h/o gastritis/GERD.  H/o Hep A several years ago.  Past abd surgeries include kidney biopsy.    Past Medical History  Diagnosis Date  . Hyperlipidemia   . Hypertension     History reviewed. No pertinent past surgical history.  History reviewed. No pertinent family history.  History  Substance Use Topics  . Smoking status: Never Smoker   . Smokeless tobacco: Not on file  . Alcohol Use: No    OB History    Grav Para Term Preterm Abortions TAB SAB Ect Mult Living                  Review of Systems  All other systems reviewed and are negative.    Allergies  Review of patient's allergies indicates no known allergies.  Home Medications   Current Outpatient Rx  Name Route Sig Dispense Refill  . ENALAPRIL MALEATE 20 MG PO TABS Oral Take 40 mg by mouth daily.      Marland Kitchen HYDROCHLOROTHIAZIDE 25 MG PO TABS Oral Take 25 mg by mouth daily.        BP 139/79  Pulse 107  Temp(Src) 99.1 F (37.3 C) (Oral)  SpO2 100%  LMP 10/20/2011  Physical Exam  Nursing note and vitals reviewed. Constitutional: She is oriented to person, place, and time. She appears well-developed and well-nourished. No distress.       Morbidly obese  HENT:  Head: Normocephalic and atraumatic.  Eyes:       Normal appearance  Neck: Normal range of  motion.  Cardiovascular: Normal rate and regular rhythm.   Pulmonary/Chest: Effort normal and breath sounds normal.  Abdominal: Soft. Bowel sounds are normal. She exhibits no distension and no mass. There is no rebound and no guarding.       Mild LUQ, epigastric and RUQ tenderness.  Pain reported to be most prominent in epigastrium and then RUQ.  No CVA tenderness.  Neurological: She is alert and oriented to person, place, and time.  Skin: Skin is warm and dry. No rash noted.  Psychiatric: She has a normal mood and affect. Her behavior is normal.    ED Course  Procedures (including critical care time)  Labs Reviewed  URINALYSIS, ROUTINE W REFLEX MICROSCOPIC - Abnormal; Notable for the following:    APPearance HAZY (*)    Leukocytes, UA MODERATE (*)    All other components within normal limits  URINE MICROSCOPIC-ADD ON - Abnormal; Notable for the following:    Squamous Epithelial / LPF FEW (*)    Bacteria, UA FEW (*)    All other components within normal limits  CBC - Abnormal; Notable for the following:    WBC 11.4 (*)    All other components within normal limits  DIFFERENTIAL - Abnormal; Notable for the following:  Neutro Abs 8.8 (*)    All other components within normal limits  COMPREHENSIVE METABOLIC PANEL - Abnormal; Notable for the following:    Sodium 134 (*)    Potassium 3.3 (*)    Albumin 3.4 (*)    All other components within normal limits  POCT PREGNANCY, URINE  LIPASE, BLOOD  POCT PREGNANCY, URINE  URINE CULTURE   US Abdomen Complete  11/14/2011  *RADIOLOGY REPORT*  Clinical Data:  The above of epigastric pain.  COMPLETE ABDOMINAL ULTRASOUND  Comparison:  None.  Findings:  Gallbladder:  No gallstones, gallbladder wall thickening, or pericholecystic fluid.  Common bile duct:  Normal caliber, measuring 3 mm diameter.  Liver:  No focal lesion identified.  Within normal limits in parenchymal echogenicity.  IVC:  Appears normal.  Pancreas:  The pancreas is obscured by  overlying bowel gas is not well visualized.  Spleen:  Spleen length measures 9.2 cm.  Limited visualization but no gross parenchymal heterogeneity.  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  No hydronephrosis.  Left Kidney:  Left kidney measures 12.4 cm length.  No hydronephrosis.  Abdominal aorta:  No aneurysm identified.  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  No hydronephrosis.  Examination is otherwise unremarkable.  Original Report Authenticated By: Marlon Pel, M.D.     1. Gastritis       MDM  Morbidly obese but otherwise healthy 30yo F presents w/ intermittent epigastric pain x 1 week.  Afebrile, NAD, diffuse upper abd ttp, worst in epigastrium and then RUQ w/out rebound on exam.  Neg Murphy's sign.  Gastritis vs. Cholelithiasis.  Will trial GI cocktail for pain and [place] abd.  Labs pending.  11:09 PM   Labs unremarkable w/ exception of possible UTI.  Pt has not had any urinary sx.  Sent for culture.  [place] neg for cholelithiasis or any other acute pathology.  Pt had relief w/ GI cocktail.  Likely has gastritis.  D/c'd home w/ pepcid, vicodin and promethazine as well as referral to GI for persistent sx.  Return precautions discussed.  1:14 AM         Otilio Miu, PA 11/14/11 [phone]

## 2011-11-14 ENCOUNTER — Emergency Department (HOSPITAL_COMMUNITY): Payer: Self-pay

## 2011-11-14 LAB — LIPASE, BLOOD: Lipase: 48 U/L (ref 11–59)

## 2011-11-14 LAB — COMPREHENSIVE METABOLIC PANEL
Albumin: 3.4 g/dL — ABNORMAL LOW (ref 3.5–5.2)
BUN: 8 mg/dL (ref 6–23)
Calcium: 9.3 mg/dL (ref 8.4–10.5)
Creatinine, Ser: 0.5 mg/dL (ref 0.50–1.10)
Total Bilirubin: 0.6 mg/dL (ref 0.3–1.2)
Total Protein: 7.7 g/dL (ref 6.0–8.3)

## 2011-11-14 MED ORDER — FAMOTIDINE 20 MG PO TABS: ORAL_TABLET | ORAL | Status: DC

## 2011-11-14 MED ORDER — PROMETHAZINE HCL 25 MG PO TABS: 25.0000 mg | ORAL_TABLET | Freq: Four times a day (QID) | ORAL | Status: DC | PRN

## 2011-11-14 MED ORDER — PROMETHAZINE HCL 25 MG PO TABS: 25.0000 mg | ORAL_TABLET | Freq: Four times a day (QID) | ORAL | Status: AC | PRN

## 2011-11-14 MED ORDER — HYDROCODONE-ACETAMINOPHEN 5-325 MG PO TABS: 1.0000 | ORAL_TABLET | ORAL | Status: AC | PRN

## 2011-11-14 NOTE — ED Notes (Signed)
Pt in US now

## 2011-11-15 NOTE — ED Provider Notes (Signed)
Medical screening examination/treatment/procedure(s) were performed by non-physician practitioner and as supervising physician I was immediately available for consultation/collaboration.   Renn Dirocco L Nadeem Romanoski, MD 11/15/11 1213 

## 2011-12-06 ENCOUNTER — Telehealth: Payer: Self-pay | Admitting: *Deleted

## 2011-12-06 NOTE — Telephone Encounter (Signed)
CALLED THE PATIENT AND LEFT 2 VOICE MESSAGES FOR HER TO RETURN CALL TO [facility]. CAN BE MADE FOR HER.  HAVE NOT HEARD BACK FROM THE PATIENT.  WILL TRY AGAIN TOMORROW TO GET IN CONTACT WITH THE PATIENT.  Mi Balla NTII  1-23-013  5:39PM

## 2011-12-20 ENCOUNTER — Encounter: Payer: Self-pay | Admitting: Internal Medicine

## 2012-01-12 ENCOUNTER — Other Ambulatory Visit: Payer: Self-pay | Admitting: Internal Medicine

## 2012-01-15 ENCOUNTER — Other Ambulatory Visit: Payer: Self-pay | Admitting: Internal Medicine

## 2012-01-15 MED ORDER — ENALAPRIL MALEATE 20 MG PO TABS: 40.0000 mg | ORAL_TABLET | Freq: Every day | ORAL | Status: DC

## 2012-07-02 ENCOUNTER — Encounter: Payer: Self-pay | Admitting: Internal Medicine

## 2012-07-02 ENCOUNTER — Ambulatory Visit (INDEPENDENT_AMBULATORY_CARE_PROVIDER_SITE_OTHER): Payer: BC Managed Care – PPO | Admitting: Internal Medicine

## 2012-07-02 VITALS — BP 122/78 | HR 95 | Temp 98.2°F | Ht 66.0 in | Wt 342.2 lb

## 2012-07-02 DIAGNOSIS — E785 Hyperlipidemia, unspecified: Secondary | ICD-10-CM

## 2012-07-02 DIAGNOSIS — N051 Unspecified nephritic syndrome with focal and segmental glomerular lesions: Secondary | ICD-10-CM

## 2012-07-02 DIAGNOSIS — N032 Chronic nephritic syndrome with diffuse membranous glomerulonephritis: Secondary | ICD-10-CM

## 2012-07-02 DIAGNOSIS — I10 Essential (primary) hypertension: Secondary | ICD-10-CM

## 2012-07-02 MED ORDER — ENALAPRIL MALEATE 20 MG PO TABS: 40.0000 mg | ORAL_TABLET | Freq: Every day | ORAL | Status: DC

## 2012-07-02 MED ORDER — HYDROCHLOROTHIAZIDE 25 MG PO TABS: 25.0000 mg | ORAL_TABLET | Freq: Every day | ORAL | Status: DC

## 2012-07-02 MED ORDER — FAMOTIDINE 20 MG PO TABS: ORAL_TABLET | ORAL | Status: DC

## 2012-07-02 NOTE — Assessment & Plan Note (Signed)
Pt BMI is 55, weight has been consistently elevated at around 340lbs. Pt endorses some effort at diet and exercise. Discussed the option of bariatric surgery with her today, but she is not open to surgery. Discussed the importance of daily exercising, even walking for just and slowly increasing over time. Discussed diet and nutrition, pt tried weight watchers in past but didn't like it.  - Gave info on DASH diet - Will discuss option of bariatric surgery at next visit after pt has had time to consider it further, not open to the conversation today.

## 2012-07-02 NOTE — Patient Instructions (Addendum)
1. Please continue to try and exercise daily, starting with walking every per day and increasing the time each week.  2. Below is some information about the DASH diet, which may help you lose weight and improve your blood pressure.  3. Please follow-up in the clinic in one year.   DASH Diet The DASH diet stands for "Dietary Approaches to Stop Hypertension." It is a healthy eating plan that has been shown to reduce high blood pressure (hypertension) in as little as 14 days, while also possibly providing other significant health benefits. These other health benefits include reducing the risk of breast cancer after menopause and reducing the risk of type 2 diabetes, heart disease, colon cancer, and stroke. Health benefits also include weight loss and slowing kidney failure in patients with chronic kidney disease.  DIET GUIDELINES  Limit salt (sodium). Your diet should contain less than 1500 mg of sodium daily.   Limit refined or processed carbohydrates. Your diet should include mostly whole grains. Desserts and added sugars should be used sparingly.   Include small amounts of heart-healthy fats. These types of fats include nuts, oils, and tub margarine. Limit saturated and trans fats. These fats have been shown to be harmful in the body.  CHOOSING FOODS  The following food groups are based on a 2000 calorie diet. See your Registered Dietitian for individual calorie needs. Grains and Grain Products (6 to 8 servings daily)  Eat More Often: Whole-wheat bread, brown rice, whole-grain or wheat pasta, quinoa, popcorn without added fat or salt (air popped).   Eat Less Often: White bread, white pasta, white rice, cornbread.  Vegetables (4 to 5 servings daily)  Eat More Often: Fresh, frozen, and canned vegetables. Vegetables may be raw, steamed, roasted, or grilled with a minimal amount of fat.   Eat Less Often/Avoid: Creamed or fried vegetables. Vegetables in a cheese sauce.  Fruit (4 to 5  servings daily)  Eat More Often: All fresh, canned (in natural juice), or frozen fruits. Dried fruits without added sugar. One hundred percent fruit juice ( cup [237 mL] daily).   Eat Less Often: Dried fruits with added sugar. Canned fruit in light or heavy syrup.  [employer], Fish, and Poultry (2 servings or less daily. One serving is 3 to 4 oz [85-114 g]).  Eat More Often: Ninety percent or leaner ground beef, tenderloin, sirloin. Round cuts of beef, chicken breast, [place] breast. All fish. Grill, bake, or broil your meat. Nothing should be fried.   Eat Less Often/Avoid: Fatty cuts of meat, [place], or chicken leg, thigh, or wing. Fried cuts of meat or fish.  Dairy (2 to 3 servings)  Eat More Often: Low-fat or fat-free milk, low-fat plain or light yogurt, reduced-fat or part-skim cheese.   Eat Less Often/Avoid: Milk (whole, 2%, skim, or chocolate).Whole milk yogurt. Full-fat cheeses.  Nuts, Seeds, and Legumes (4 to 5 servings per week)  Eat More Often: All without added salt.   Eat Less Often/Avoid: Salted nuts and seeds, canned beans with added salt.  Fats and Sweets (limited)  Eat More Often: Vegetable oils, tub margarines without trans fats, sugar-free gelatin. Mayonnaise and salad dressings.   Eat Less Often/Avoid: Coconut oils, palm oils, butter, stick margarine, cream, half and half, cookies, candy, pie.  FOR MORE INFORMATION The Dash Diet Eating Plan: www.dashdiet.org Document Released: 10/19/2011 Document Reviewed: 10/09/2011 [facility] Patient Information 2012 [place], [place].

## 2012-07-02 NOTE — Progress Notes (Signed)
Subjective:   Patient ID: Deborah Gonzales female   DOB: [birth year] 31 y.o.   MRN: [id number]  HPI: [name] is a 31 y.o. female w pmh of FSGS, HTN, and hyperlipidemia presenting for checkup. She has no complaints today.  Says that she is compliant with her BP medication (enalapril 40mg  daily +HCTZ 25mg  daily). She is trying to eat a balanced diet but finds it difficult with her nighttime working schedule. She says she has lost 9 lbs in the past couple of months by walking more.  She is overdue for PAP smear but refuses today because she has never been sexually active and finds the exam too uncomfortable.     Past Medical History  Diagnosis Date  . Hyperlipidemia   . Hypertension    Current Outpatient Prescriptions  Medication Sig Dispense Refill  . enalapril (VASOTEC) 20 MG tablet Take 2 tablets (40 mg total) by mouth daily.  180 tablet  4  . famotidine (PEPCID AC) 20 MG tablet One po bid x 3 days and then prn upper abdominal pain  20 tablet  0  . hydrochlorothiazide (HYDRODIURIL) 25 MG tablet Take 25 mg by mouth daily.         No family history on file. History   Social History  . Marital Status: Single    Spouse Name: N/A    Number of Children: N/A  . Years of Education: N/A   Social History Main Topics  . Smoking status: Never Smoker   . Smokeless tobacco: None  . Alcohol Use: No  . Drug Use: None  . Sexually Active: None   Other Topics Concern  . None   Social History Narrative  . None   Review of Systems: Constitutional: Denies fever, chills, diaphoresis, appetite change and fatigue.  HEENT: Denies eye redness, hearing loss, ear pain, congestion, sore throat, rhinorrhea, sneezing, mouth sores, trouble swallowing, neck pain, neck stiffness   Respiratory: Denies SOB, DOE, cough, chest tightness,  and wheezing.   Cardiovascular: Denies chest pain, palpitations and leg swelling.  Gastrointestinal: Denies nausea, vomiting, abdominal pain, diarrhea,  constipation, blood in stool and abdominal distention.  Genitourinary: Denies dysuria, urgency, frequency, hematuria, flank pain and difficulty urinating.  Musculoskeletal: Denies myalgias, back pain, joint swelling, arthralgias and gait problem.  Skin: Denies rash  Neurological: Denies dizziness, seizures, syncope, weakness, light-headedness, numbness and headaches.  Hematological: Denies adenopathy Psychiatric/Behavioral: Denies mood changes, sleep disturbance    Objective:  Physical Exam: Filed Vitals:   07/02/12 1330  BP: 122/78  Pulse: 95  Temp: 98.2 F (36.8 C)  TempSrc: Oral  Height: 5\' 6"  (1.676 m)  Weight: 342 lb 3.2 oz (155.221 kg)  SpO2: 100%   Constitutional: Vital signs reviewed.  Patient is an obese female in no acute distress and cooperative with exam. Alert and oriented x3.  Head: Normocephalic and atraumatic. Hypertrichosis over chin and jawline Ear: TM normal bilaterally Mouth: no erythema or exudates, MMM Eyes: PERRL, EOMI, conjunctivae normal, No scleral icterus.  Neck: Supple, Trachea midline normal ROM, No JVD, mass, thyromegaly, or carotid bruit present.  Cardiovascular: Heart sounds difficult to auscultate due to body habitus, RRR, S1 normal, S2 normal, no MRG, pulses symmetric and intact bilaterally Pulmonary/Chest: CTAB, no wheezes, rales, or rhonchi Abdominal: Soft. Non-tender, non-distended, bowel sounds are normal, no masses, organomegaly, or guarding present.  GU: no CVA tenderness Musculoskeletal: No joint deformities, erythema, or stiffness, ROM full and no nontender Hematology: no cervical or axillary LAD Breast: Soft with no  masses or overlying skin changes Neurological: A&O x3, Strength is normal and symmetric bilaterally, cranial nerve II-XII are grossly intact, no focal motor deficit, sensory intact to light touch bilaterally.  Skin: Warm, dry and intact. No rash, cyanosis, or clubbing.  Psychiatric: Normal mood and affect. speech and behavior  is normal. Judgment and thought content normal. Cognition and memory are normal.   Assessment & Plan:

## 2012-07-02 NOTE — Assessment & Plan Note (Signed)
Last LDL at goal. Pt not on any cholesterol lowering medications. - Recheck lipid panel today. Pt not fasting.

## 2012-07-02 NOTE — Progress Notes (Signed)
INTERNAL MEDICINE TEACHING ATTENDING ADDENDUM Deborah Gonzales , MD: I personally saw and evaluated Deborah Gonzales in this clinic visit in conjunction with the resident, Dr. Heloise Beecham. I have discussed the patient's plan of care with Dr. Loura Pardon. during this visit. I have confirmed the physical exam findings and have read and agree with the clinic note including the plan.We need to find her last renal doctor for a follow up visit and also offer her HIV test during her next visit if we do not find HIV test in her previous records.

## 2012-07-02 NOTE — Assessment & Plan Note (Addendum)
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 Per review of records, Dr. Caryn Section at [facility]. saw pt in 2003 and recommended pt continue on ACE-i and HCTZ w target BP of 120/70-130/80. No routine follow-up with nephrology is necessary unless decline in renal function or worsening proteinuria. She is adequately treated for her FSGS w ACE-i therapy. - Will follow-up Cr and Microalb:Cr ratio, advise follow up w renal if indicated by lab results  Micro:Cr ratio is 22.9, improved from 155 1 yr ago. No need for renal follow-up at this time as pt on adequate therapy, will continue to monitor.  Bronson Curb 07/09/2012 3:04 PM

## 2012-07-02 NOTE — Progress Notes (Signed)
Patient ID: Deborah Gonzales, female   DOB: 1981/10/12, 31 y.o.   MRN: [id number]

## 2012-07-02 NOTE — Assessment & Plan Note (Signed)
Lab Results  Component Value Date   NA 134* 11/13/2011   K 3.3* 11/13/2011   CL 99 11/13/2011   CO2 25 11/13/2011   BUN 8 11/13/2011   CREATININE 0.50 11/13/2011   CREATININE 0.47 03/21/2011    BP Readings from Last 3 Encounters:  07/02/12 122/78  11/14/11 145/79  08/24/11 129/86    Assessment: Hypertension control:  controlled  Progress toward goals:  at goal Barriers to meeting goals:  no barriers identified  Plan: Hypertension treatment:  continue current medications Will check microalbumin:Cr ratio, BMP, UA today. Pt w h/o of FSGS per outside records. Previously evaluated by [facility], has not had follow-up in several years. Said her last visit was with [facility] but they have no record of seeing her there. Will investigate further and attempt to obtain all records. Pt will likely need to f/u w last nephrologist.

## 2012-07-03 LAB — BASIC METABOLIC PANEL WITH GFR
CO2: 28 mEq/L (ref 19–32)
Chloride: 105 mEq/L (ref 96–112)
GFR, Est African American: >89 mL/min
Glucose, Bld: 87 mg/dL (ref 70–99)
Potassium: 3.6 mEq/L (ref 3.5–5.3)
Sodium: 140 mEq/L (ref 135–145)

## 2012-07-03 LAB — URINALYSIS, ROUTINE W REFLEX MICROSCOPIC
Bilirubin Urine: NEGATIVE
Glucose, UA: NEGATIVE mg/dL
Hgb urine dipstick: NEGATIVE
pH: 6 (ref 5.0–8.0)

## 2012-07-03 LAB — URINALYSIS, MICROSCOPIC ONLY: Casts: NONE SEEN

## 2012-07-03 LAB — LIPID PANEL
HDL: 43 mg/dL (ref >39)
LDL Cholesterol: 118 mg/dL — ABNORMAL HIGH (ref 0–99)
Total CHOL/HDL Ratio: 4 Ratio
Triglycerides: 62 mg/dL (ref <150)

## 2012-11-19 ENCOUNTER — Telehealth: Payer: Self-pay | Admitting: *Deleted

## 2012-11-19 NOTE — Telephone Encounter (Signed)
Pt called and asked if it is okay for her to take a dietary supplement called "skinnie fiber" Pt # [id number] Please advise.

## 2012-11-20 NOTE — Telephone Encounter (Signed)
Hi Deborah Gonzales,  I am not familiar with this supplement or its main ingredients. I cannot it as it may have harmful effects and there is no evidence to show it is beneficial. Please let her know that she can come to the clinic to discuss healthy weight loss strategies with Norm Parcel or myself.  Thank you Bronson Curb

## 2012-11-21 NOTE — Telephone Encounter (Signed)
Pt informed and will make appointment

## 2013-01-30 ENCOUNTER — Encounter: Payer: Self-pay | Admitting: Internal Medicine

## 2013-01-30 ENCOUNTER — Ambulatory Visit (INDEPENDENT_AMBULATORY_CARE_PROVIDER_SITE_OTHER): Payer: BC Managed Care – PPO | Admitting: Internal Medicine

## 2013-01-30 DIAGNOSIS — I10 Essential (primary) hypertension: Secondary | ICD-10-CM

## 2013-01-30 DIAGNOSIS — M25569 Pain in unspecified knee: Secondary | ICD-10-CM

## 2013-01-30 DIAGNOSIS — M25561 Pain in right knee: Secondary | ICD-10-CM | POA: Insufficient documentation

## 2013-01-30 NOTE — Assessment & Plan Note (Signed)
BP Readings from Last 3 Encounters:  01/30/13 122/82  07/02/12 122/78  11/14/11 145/79    Lab Results  Component Value Date   NA 140 07/02/2012   K 3.6 07/02/2012   CREATININE 0.57 07/02/2012    Assessment:  Blood pressure control:  at goal  Progress toward BP goal:   at goal    Plan:  Medications:  continue current medications  Educational resources provided: brochure  Self management tools provided: other (see comments)

## 2013-01-30 NOTE — Assessment & Plan Note (Signed)
Cr and Microalb:Cr have been stable and BP at goal on enalapril and HCTZ. Will refer patient to nephrology since >10 years since last visit. Have requested records.   Lab Results  Component Value Date   CREATININE 0.57 07/02/2012   Lab Results  Component Value Date   MICROALBUR 4.23* 07/02/2012

## 2013-01-30 NOTE — Assessment & Plan Note (Signed)
Body mass index is 56.05 kg/(m^2). Lifestyle changes unsuccessful, 5lb weight gain since last visit. Discussed the implications of future morbidity from obesity, including increased CVD, cancers, joint problems.  Patient now willing to consider bariatric surgery. Information for support group informational sessions provided. Patient says she will attend, continue to attempt dietary modifications.

## 2013-01-30 NOTE — Assessment & Plan Note (Signed)
Lab Results  Component Value Date   CHOL 173 07/02/2012   HDL 43 07/02/2012   LDLCALC 118* 07/02/2012   TRIG 62 07/02/2012   CHOLHDL 4.0 07/02/2012   Last LDL at goal. Not on meds. Recheck in 1 year

## 2013-01-30 NOTE — Patient Instructions (Addendum)
1. Please call about [facility] [phone].  2. I have made a referral for you to follow-up with your nephrologist about your kidney disease. 3. You should have plenty of medication refills. 4. Come back and see me in 6 months or sooner if needed.

## 2013-01-30 NOTE — Assessment & Plan Note (Signed)
Pain is mild and intermittent, worse with prolonged standing. She does not require any OTC pain meds. Her knee exam is normal without effusion. I think she may have early joint arthropathy as consequence of her obesity.  Warning s/s discussed, patient to RTC if sx become bothersome. No indication for imaging today. She can take acetaminophen for pain.

## 2013-01-30 NOTE — Progress Notes (Signed)
Patient ID: Deborah Gonzales, female   DOB: 05-Mar-1981, 32 y.o.   MRN: [id number]  Subjective:   Patient ID: Deborah Gonzales female   DOB: 1981-05-08 32 y.o.   MRN: [id number]  HPI: Deborah Gonzales is a 32 y.o. female w pmh of FSGS, HTN, morbid obesity, and hyperlipidemia presenting for checkup.  She says she has some mild R knee pain from where she twisted it several months ago. Pain with prolonged standing or walking. No antecedent trauma or falls. Only hurts occasionally, does not take any OTC meds for it. Says that she is compliant with her BP medication (enalapril 40mg  daily +HCTZ 25mg  daily).  In terms of her weight loss, She is trying to eat a balanced diet and cut out eating out and fried foods and sodas. She has gained 5 lbs since last visit 6 months ago and is frustrated. At last visit, I counseled on bariatric surgery. She is interested today.  In terms of FSGS, she remains on ACE-i therapy with stable Cr. Last visit w nephrologist in 2003, recommended annual monitoring of Cr and urine microalb:Cr. Renal function has remained stable with no proteinuria. Denies dysuria, LE edema, change in urine color/character.  She is overdue for PAP smear but refuses today because she has never been sexually active and finds the exam too uncomfortable.   Past Medical History  Diagnosis Date  . Hyperlipidemia   . Hypertension   . 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 On appropriate therapy with ACE-i  . Severe obesity (BMI >= 40)     BMI 55   Current Outpatient Prescriptions  Medication Sig Dispense Refill  . enalapril (VASOTEC) 20 MG tablet Take 2 tablets (40 mg total) by mouth daily.  180 tablet  4  . hydrochlorothiazide (HYDRODIURIL) 25 MG tablet Take 1 tablet (25 mg total) by mouth daily.  30 tablet  11   No current facility-administered medications for this visit.   No family history on file. History   Social  History  . Marital Status: Single    Spouse Name: N/A    Number of Children: N/A  . Years of Education: N/A   Social History Main Topics  . Smoking status: Never Smoker   . Smokeless tobacco: Not on file  . Alcohol Use: No  . Drug Use: Not on file  . Sexually Active: Not on file   Other Topics Concern  . Not on file   Social History Narrative  . No narrative on file   Review of Systems: 10 pt ROS performed, pertinent positives and negatives noted in HPI Objective:  Physical Exam: Filed Vitals:   01/30/13 0857  BP: 122/82  Pulse: 100  Temp: 97.8 F (36.6 C)  TempSrc: Oral  Height: 5\' 6"  (1.676 m)  Weight: 347 lb 1.6 oz (157.444 kg)  SpO2: 100%   Constitutional: Vital signs reviewed. Patient is an obese female in no acute distress and cooperative with exam. Alert and oriented x3.  Head: Normocephalic and atraumatic. Hypertrichosis over chin and jawline  Ear: TM normal bilaterally  Mouth: no erythema or exudates, MMM  Eyes: PERRL, EOMI, conjunctivae normal, No scleral icterus.  Neck: Supple, Trachea midline normal ROM, No JVD, mass, thyromegaly, or carotid bruit present.  Cardiovascular: Heart sounds difficult to auscultate due to body habitus, RRR, S1 normal, S2 normal, no MRG, pulses symmetric and intact bilaterally  Chest: No lumps palpated on breast exam  Pulmonary/Chest: CTAB, no wheezes, rales, or rhonchi  Abdominal: Soft. Non-tender, non-distended, bowel sounds are normal, no masses, organomegaly, or guarding present.  GU: no CVA tenderness Musculoskeletal: No joint deformities, erythema, or stiffness, ROM full and no nontender. R knee with full ROM, negative McMurray and Lachman, no effusions  Hematology: no cervical or axillary LAD  Breast: Soft with no masses or overlying skin changes Neurological: A&O x3, Strength is normal and symmetric bilaterally, cranial nerve II-XII are grossly intact, no focal motor deficit, sensory intact to light touch bilaterally.   Skin: Warm, dry and intact. No rash, cyanosis, or clubbing.  Psychiatric: Normal mood and affect. speech and behavior is normal. Judgment and thought content normal. Cognition and memory are normal.  Assessment & Plan:   Please see problem-based charting for assessment and plan.

## 2013-02-04 ENCOUNTER — Other Ambulatory Visit: Payer: Self-pay | Admitting: *Deleted

## 2013-02-04 DIAGNOSIS — I10 Essential (primary) hypertension: Secondary | ICD-10-CM

## 2013-02-04 MED ORDER — HYDROCHLOROTHIAZIDE 25 MG PO TABS: 25.0000 mg | ORAL_TABLET | Freq: Every day | ORAL | Status: DC

## 2013-02-04 MED ORDER — ENALAPRIL MALEATE 20 MG PO TABS: 40.0000 mg | ORAL_TABLET | Freq: Every day | ORAL | Status: DC

## 2013-12-04 ENCOUNTER — Ambulatory Visit (INDEPENDENT_AMBULATORY_CARE_PROVIDER_SITE_OTHER): Payer: BC Managed Care – PPO | Admitting: Family Medicine

## 2013-12-04 VITALS — BP 122/78 | HR 95 | Temp 99.1°F | Resp 16 | Ht 65.5 in | Wt 350.0 lb

## 2013-12-04 DIAGNOSIS — R197 Diarrhea, unspecified: Secondary | ICD-10-CM

## 2013-12-04 DIAGNOSIS — R1011 Right upper quadrant pain: Secondary | ICD-10-CM

## 2013-12-04 DIAGNOSIS — K529 Noninfective gastroenteritis and colitis, unspecified: Secondary | ICD-10-CM

## 2013-12-04 DIAGNOSIS — A084 Viral intestinal infection, unspecified: Secondary | ICD-10-CM

## 2013-12-04 LAB — POCT CBC
GRANULOCYTE PERCENT: 60.5 % (ref 37–80)
HEMATOCRIT: 41.6 % (ref 37.7–47.9)
Hemoglobin: 12.9 g/dL (ref 12.2–16.2)
LYMPH, POC: 1.6 (ref 0.6–3.4)
MCH, POC: 27.1 pg (ref 27–31.2)
MCHC: 31 g/dL — AB (ref 31.8–35.4)
MCV: 87.3 fL (ref 80–97)
MID (CBC): 0.4 (ref 0–0.9)
MPV: 8.9 fL (ref 0–99.8)
POC Granulocyte: 3 (ref 2–6.9)
POC LYMPH %: 31.9 % (ref 10–50)
POC MID %: 7.6 %M (ref 0–12)
Platelet Count, POC: 436 10*3/uL — AB (ref 142–424)
RBC: 4.76 M/uL (ref 4.04–5.48)
RDW, POC: 14.5 %
WBC: 5 10*3/uL (ref 4.6–10.2)

## 2013-12-04 MED ORDER — RANITIDINE HCL 150 MG PO TABS: 150.0000 mg | ORAL_TABLET | Freq: Two times a day (BID) | ORAL | Status: DC

## 2013-12-04 NOTE — Patient Instructions (Signed)
Viral Gastroenteritis Viral gastroenteritis is also known as stomach flu. This condition affects the stomach and intestinal tract. It can cause sudden diarrhea and vomiting. The illness typically lasts 3 to 8 days. Most people develop an immune response that eventually gets rid of the virus. While this natural response develops, the virus can make you quite ill. CAUSES  Many different viruses can cause gastroenteritis, such as rotavirus or noroviruses. You can catch one of these viruses by consuming contaminated food or water. You may also catch a virus by sharing utensils or other personal items with an infected person or by touching a contaminated surface. SYMPTOMS  The most common symptoms are diarrhea and vomiting. These problems can cause a severe loss of body fluids (dehydration) and a body salt (electrolyte) imbalance. Other symptoms may include:  Fever.  Headache.  Fatigue.  Abdominal pain. DIAGNOSIS  Your caregiver can usually diagnose viral gastroenteritis based on your symptoms and a physical exam. A stool sample may also be taken to test for the presence of viruses or other infections. TREATMENT  This illness typically goes away on its own. Treatments are aimed at rehydration. The most serious cases of viral gastroenteritis involve vomiting so severely that you are not able to keep fluids down. In these cases, fluids must be given through an intravenous line (IV). HOME CARE INSTRUCTIONS   Drink enough fluids to keep your urine clear or pale yellow. Drink small amounts of fluids frequently and increase the amounts as tolerated.  Ask your caregiver for specific rehydration instructions.  Avoid:  Foods high in sugar.  Alcohol.  Carbonated drinks.  Tobacco.  Juice.  Caffeine drinks.  Extremely hot or cold fluids.  Fatty, greasy foods.  Too much intake of anything at one time.  Dairy products until 24 to 48 hours after diarrhea stops.  You may consume probiotics.  Probiotics are active cultures of beneficial bacteria. They may lessen the amount and number of diarrheal stools in adults. Probiotics can be found in yogurt with active cultures and in supplements.  Wash your hands well to avoid spreading the virus.  Only take over-the-counter or prescription medicines for pain, discomfort, or fever as directed by your caregiver. Do not give aspirin to children. Antidiarrheal medicines are not recommended.  Ask your caregiver if you should continue to take your regular prescribed and over-the-counter medicines.  Keep all follow-up appointments as directed by your caregiver. SEEK IMMEDIATE MEDICAL CARE IF:   You are unable to keep fluids down.  You do not urinate at least once every 6 to 8 hours.  You develop shortness of breath.  You notice blood in your stool or vomit. This may look like coffee grounds.  You have abdominal pain that increases or is concentrated in one small area (localized).  You have persistent vomiting or diarrhea.  You have a fever.  The patient is a child younger than 3 months, and he or she has a fever.  The patient is a child older than 3 months, and he or she has a fever and persistent symptoms.  The patient is a child older than 3 months, and he or she has a fever and symptoms suddenly get worse.  The patient is a baby, and he or she has no tears when crying. MAKE SURE YOU:   Understand these instructions.  Will watch your condition.  Will get help right away if you are not doing well or get worse. Document Released: 10/30/2005 Document Revised: 01/22/2012 Document Reviewed: 08/16/2011   [employer] Patient Information 2014 [employer]  

## 2013-12-04 NOTE — Progress Notes (Addendum)
Subjective:    Patient ID: Deborah Gonzales, female    DOB: 1981-08-29, 33 y.o.   MRN: [id number] Chief Complaint  Patient presents with  . Nausea    Noticed sunday after eating hotdogs from [employer]  . Diarrhea    HPI This chart was scribed for Carley Hammed Cornisha Zetino-MD by Smiley Houseman, Scribe. This patient was seen in room 9 and the patient's care was started at 8:09 AM.  HPI Comments: Deborah Gonzales is a 33 y.o. female who presents to the [facility] complaining of constant nausea and intermittent diarrhea that started about 4 days ago.  Pt states her stool is small volume and loose, but denies watery stools.  Pt reports she has diarrhea 2 times per day.  She also complains of epigastric abdominal pain that she describes as an aching pain.  Pt states that she had two hot dogs with chili from [employer] on Sunday and the symptoms started soon after.  She states that she had a subjective fever on Sunday, but not since then.  Pt denies chest pain, emesis, urinary symptoms, and vaginal discharge.  Pt states that she has been pushing fluids - drinking water mainly and eating grits.  Pt states that she took one dose of imodium yesterday without relief.  Pt states that she hasn't taken any antibiotics within the last 2 months.  She denies any sick contacts at home.    Pt has a h/o of chronic kidney disease, specifically focal segmental glomerulosclerosis. Her creatine level was .57 in August 2014.  This was diagnosed after a prior bout of food poisoning so she became very nervous when she had it again.     History reviewed. No pertinent past surgical history.  Family History  Problem Relation Age of Onset  . Hypertension Mother   . Hypertension Father   . Diabetes Father     History   Social History  . Marital Status: Single    Spouse Name: N/A    Number of Children: N/A  . Years of Education: N/A   Occupational History  . Not on file.   Social History Main Topics  .  Smoking status: Never Smoker   . Smokeless tobacco: Not on file  . Alcohol Use: No  . Drug Use: Not on file  . Sexual Activity: Not on file   Other Topics Concern  . Not on file   Social History Narrative  . No narrative on file    No Known Allergies  Patient Active Problem List   Diagnosis Date Noted  . Right knee pain 01/30/2013  . FSGS (focal segmental glomerulosclerosis) 07/02/2012  . HYPERLIPIDEMIA 10/25/2006  . Severe obesity (BMI >= 40) 10/25/2006  . HYPERTENSION 10/25/2006    Review of Systems  Constitutional: Negative for fever and chills.  HENT: Negative for congestion, rhinorrhea and sore throat.   Respiratory: Negative for cough and shortness of breath.   Cardiovascular: Negative for chest pain.  Gastrointestinal: Positive for nausea, abdominal pain (epigastric) and diarrhea. Negative for vomiting.  Genitourinary: Negative for vaginal discharge and difficulty urinating.  Musculoskeletal: Negative for back pain.  Skin: Negative for color change and rash.  Neurological: Negative for syncope.    Triage Vitals: BP 122/78  Pulse 95  Temp(Src) 99.1 F (37.3 C) (Oral)  Resp 16  Ht 5' 5.5" (1.664 m)  Wt 350 lb (158.759 kg)  BMI 57.34 kg/m2  SpO2 97%  LMP 11/26/2013 Objective:   Physical Exam  Nursing  note and vitals reviewed. Constitutional: She is oriented to person, place, and time. She appears well-developed and well-nourished. No distress.  HENT:  Head: Normocephalic and atraumatic.  Right Ear: External ear normal.  Left Ear: External ear normal.  Eyes: Conjunctivae and EOM are normal. Right eye exhibits no discharge. Left eye exhibits no discharge.  Neck: Neck supple. No tracheal deviation present.  Cardiovascular: Normal rate, regular rhythm and normal heart sounds.   No murmur heard. Pulmonary/Chest: Effort normal and breath sounds normal. No respiratory distress. She has no wheezes. She has no rales.  Abdominal: Soft. Bowel sounds are normal. She  exhibits no distension and no mass. There is no hepatosplenomegaly. There is tenderness in the right upper quadrant and epigastric area. There is no rebound, no guarding and no CVA tenderness.  Musculoskeletal: Normal range of motion.  Neurological: She is alert and oriented to person, place, and time.  Skin: Skin is warm and dry.  Psychiatric: She has a normal mood and affect. Her behavior is normal.      Results for orders placed in visit on 12/04/13  POCT CBC      Result Value Range   WBC 5.0  4.6 - 10.2 K/uL   Lymph, poc 1.6  0.6 - 3.4   POC LYMPH PERCENT 31.9  10 - 50 %L   MID (cbc) 0.4  0 - 0.9   POC MID % 7.6  0 - 12 %M   POC Granulocyte 3.0  2 - 6.9   Granulocyte percent 60.5  37 - 80 %G   RBC 4.76  4.04 - 5.48 M/uL   Hemoglobin 12.9  12.2 - 16.2 g/dL   HCT, POC 29.541.6  62.137.7 - 47.9 %   MCV 87.3  80 - 97 fL   MCH, POC 27.1  27 - 31.2 pg   MCHC 31.0 (*) 31.8 - 35.4 g/dL   RDW, POC 30.814.5     Platelet Count, POC 436 (*) 142 - 424 K/uL   MPV 8.9  0 - 99.8 fL   Assessment & Plan:    Diarrhea - Plan: POCT CBC  Suspect viral gastroenteritis.  Informed pt to refrain from alcohol use, greasy foods, and Tylenol.  Pt should be better within 1 wk, but if symptoms have not improved pt should return for repeat eval and labs.    Meds ordered this encounter  Medications  . ranitidine (ZANTAC) 150 MG tablet    Sig: Take 1 tablet (150 mg total) by mouth 2 (two) times daily.    Dispense:  60 tablet    Refill:  0    I personally performed the services described in this documentation, which was scribed in my presence. The recorded information has been reviewed and considered, and addended by me as needed.  Norberto SorensonEva Kaylah Chiasson, MD MPH

## 2013-12-08 ENCOUNTER — Ambulatory Visit
Admission: RE | Admit: 2013-12-08 | Discharge: 2013-12-08 | Disposition: A | Discharge status: Home / Self Care | Payer: BC Managed Care – PPO | Source: Ambulatory Visit | Attending: Family Medicine | Admitting: Family Medicine

## 2013-12-08 ENCOUNTER — Telehealth: Payer: Self-pay | Admitting: Family Medicine

## 2013-12-08 ENCOUNTER — Ambulatory Visit (INDEPENDENT_AMBULATORY_CARE_PROVIDER_SITE_OTHER): Payer: BC Managed Care – PPO | Admitting: Family Medicine

## 2013-12-08 VITALS — BP 118/80 | HR 91 | Temp 98.6°F | Resp 16 | Ht 65.5 in | Wt 350.0 lb

## 2013-12-08 DIAGNOSIS — R1013 Epigastric pain: Secondary | ICD-10-CM

## 2013-12-08 LAB — COMPREHENSIVE METABOLIC PANEL
ALK PHOS: 74 U/L (ref 39–117)
ALT: 13 U/L (ref 0–35)
AST: 16 U/L (ref 0–37)
Albumin: 3.8 g/dL (ref 3.5–5.2)
BILIRUBIN TOTAL: 0.6 mg/dL (ref 0.3–1.2)
BUN: 10 mg/dL (ref 6–23)
CO2: 31 mEq/L (ref 19–32)
CREATININE: 0.56 mg/dL (ref 0.50–1.10)
Calcium: 9.6 mg/dL (ref 8.4–10.5)
Chloride: 103 mEq/L (ref 96–112)
GLUCOSE: 89 mg/dL (ref 70–99)
Potassium: 4.1 mEq/L (ref 3.5–5.3)
SODIUM: 138 meq/L (ref 135–145)
TOTAL PROTEIN: 7.4 g/dL (ref 6.0–8.3)

## 2013-12-08 LAB — POCT UA - MICROSCOPIC ONLY
CRYSTALS, UR, HPF, POC: NEGATIVE
Casts, Ur, LPF, POC: NEGATIVE
Mucus, UA: NEGATIVE
Yeast, UA: NEGATIVE

## 2013-12-08 LAB — POCT CBC
Granulocyte percent: 66.8 %G (ref 37–80)
HCT, POC: 40.7 % (ref 37.7–47.9)
Hemoglobin: 12.8 g/dL (ref 12.2–16.2)
LYMPH, POC: 1.5 (ref 0.6–3.4)
MCH: 27.4 pg (ref 27–31.2)
MCHC: 31.4 g/dL — AB (ref 31.8–35.4)
MCV: 87 fL (ref 80–97)
MID (CBC): 0.4 (ref 0–0.9)
MPV: 9.3 fL (ref 0–99.8)
PLATELET COUNT, POC: 448 10*3/uL — AB (ref 142–424)
POC Granulocyte: 3.9 (ref 2–6.9)
POC LYMPH %: 26.2 % (ref 10–50)
POC MID %: 7 %M (ref 0–12)
RBC: 4.68 M/uL (ref 4.04–5.48)
RDW, POC: 14.2 %
WBC: 5.8 10*3/uL (ref 4.6–10.2)

## 2013-12-08 LAB — POCT URINALYSIS DIPSTICK
Bilirubin, UA: NEGATIVE
GLUCOSE UA: NEGATIVE
Ketones, UA: NEGATIVE
Nitrite, UA: NEGATIVE
SPEC GRAV UA: 1.015
UROBILINOGEN UA: 0.2
pH, UA: 6

## 2013-12-08 LAB — LIPASE: Lipase: 27 U/L (ref 0–75)

## 2013-12-08 MED ORDER — ONDANSETRON 4 MG PO TBDP
4.0000 mg | ORAL_TABLET | Freq: Once | ORAL | Status: AC
  Administered 2013-12-08: 4 mg via ORAL

## 2013-12-08 NOTE — Progress Notes (Signed)
Subjective:    Patient ID: Deborah Gonzales, female    DOB: 08/25/1981, 33 y.o.   MRN: [id number] Chief Complaint  Patient presents with  . Follow-up    abdominal pain , nausea     HPI  Pt was seen in clinic 4d previously and was diagnosed with viral gastroenteritis - developed sxs after eating hot dogs at [employer].  She had nausea, occ subj fever, and small volume loose stools about twice a day for several days. She did have some mild epigastric and RUQ tenderness on exam. CBC was nml.  She was reassured and treated symptomatically but pt was concerned as originally her focal segmental glomerulonephritis was diagnosed after a similar episode of gastroenteritis.  Still having some waxing/waning but constant epigastric abd pain radiating to RUQ but not to back.  When pain is severe she gets very nauseas. Diarrhea has stopped.  No further fevers but has had some chills. No lightheaded, dizziness or HAs but has been very fatigued. Appetite reduced - trying some grits, chicken and rice, bagel, toast.  Abd pain does not seem to be connected to food.  Has been pushing fluids and drinking a little ginger ale. Voiding normally.  Past Medical History  Diagnosis Date  . Hyperlipidemia   . Hypertension   . 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 On appropriate therapy with ACE-i  . Severe obesity (BMI >= 40)     BMI 55   Current Outpatient Prescriptions on File Prior to Visit  Medication Sig Dispense Refill  . enalapril (VASOTEC) 20 MG tablet Take 2 tablets (40 mg total) by mouth daily.  180 tablet  3  . hydrochlorothiazide (HYDRODIURIL) 25 MG tablet Take 1 tablet (25 mg total) by mouth daily.  90 tablet  3  . ranitidine (ZANTAC) 150 MG tablet Take 1 tablet (150 mg total) by mouth 2 (two) times daily.  60 tablet  0   No current facility-administered medications on file prior to visit.   No Known Allergies   Review of Systems    Constitutional: Positive for chills, activity change, appetite change and fatigue. Negative for fever, diaphoresis and unexpected weight change.  Respiratory: Negative for shortness of breath.   Cardiovascular: Negative for chest pain and leg swelling.  Gastrointestinal: Positive for nausea and abdominal pain. Negative for vomiting, diarrhea, constipation, blood in stool and abdominal distention.  Genitourinary: Negative for dysuria, decreased urine volume and difficulty urinating.  Musculoskeletal: Negative for gait problem.  Skin: Negative for rash.  Hematological: Negative for adenopathy.  Psychiatric/Behavioral: Negative for sleep disturbance.      BP 118/80  Pulse 91  Temp(Src) 98.6 F (37 C) (Oral)  Resp 16  Ht 5' 5.5" (1.664 m)  Wt 350 lb (158.759 kg)  BMI 57.34 kg/m2  SpO2 98%  LMP 11/26/2013  Objective:   Physical Exam  Constitutional: She is oriented to person, place, and time. She appears well-developed and well-nourished. No distress.  HENT:  Head: Normocephalic and atraumatic.  Neck: Normal range of motion. Neck supple. No thyromegaly present.  Cardiovascular: Normal rate, regular rhythm, normal heart sounds and intact distal pulses.   Pulmonary/Chest: Effort normal and breath sounds normal. No respiratory distress.  Abdominal: Soft. Bowel sounds are decreased. There is no hepatosplenomegaly. There is tenderness in the right upper quadrant. There is no rigidity, no rebound, no guarding, no CVA tenderness, no tenderness at McBurney's point and negative Murphy's sign.  Exam limited by abd obesity  Musculoskeletal: She exhibits no edema.  Lymphadenopathy:    She has no cervical adenopathy.  Neurological: She is alert and oriented to person, place, and time.  Skin: Skin is warm and dry. She is not diaphoretic. No erythema.  Psychiatric: She has a normal mood and affect. Her behavior is normal.      Results for orders placed in visit on 12/08/13  COMPREHENSIVE  METABOLIC PANEL      Result Value Range   Sodium 138  135 - 145 mEq/L   Potassium 4.1  3.5 - 5.3 mEq/L   Chloride 103  96 - 112 mEq/L   CO2 31  19 - 32 mEq/L   Glucose, Bld 89  70 - 99 mg/dL   BUN 10  6 - 23 mg/dL   Creat 9.14  7.82 - 9.56 mg/dL   Total Bilirubin 0.6  0.3 - 1.2 mg/dL   Alkaline Phosphatase 74  39 - 117 U/L   AST 16  0 - 37 U/L   ALT 13  0 - 35 U/L   Total Protein 7.4  6.0 - 8.3 g/dL   Albumin 3.8  3.5 - 5.2 g/dL   Calcium 9.6  8.4 - 21.3 mg/dL  LIPASE      Result Value Range   Lipase 27  0 - 75 U/L  POCT CBC      Result Value Range   WBC 5.8  4.6 - 10.2 K/uL   Lymph, poc 1.5  0.6 - 3.4   POC LYMPH PERCENT 26.2  10 - 50 %L   MID (cbc) 0.4  0 - 0.9   POC MID % 7.0  0 - 12 %M   POC Granulocyte 3.9  2 - 6.9   Granulocyte percent 66.8  37 - 80 %G   RBC 4.68  4.04 - 5.48 M/uL   Hemoglobin 12.8  12.2 - 16.2 g/dL   HCT, POC 08.6  57.8 - 47.9 %   MCV 87.0  80 - 97 fL   MCH, POC 27.4  27 - 31.2 pg   MCHC 31.4 (*) 31.8 - 35.4 g/dL   RDW, POC 46.9     Platelet Count, POC 448 (*) 142 - 424 K/uL   MPV 9.3  0 - 99.8 fL  POCT URINALYSIS DIPSTICK      Result Value Range   Color, UA yellow     Clarity, UA clear     Glucose, UA neg     Bilirubin, UA neg     Ketones, UA neg     Spec Grav, UA 1.015     Blood, UA small     pH, UA 6.0     Protein, UA 100mg      Urobilinogen, UA 0.2     Nitrite, UA neg     Leukocytes, UA small (1+)    POCT UA - MICROSCOPIC ONLY      Result Value Range   WBC, Ur, HPF, POC 3-8     RBC, urine, microscopic 4-16     Bacteria, U Microscopic 3+     Mucus, UA neg     Epithelial cells, urine per micros 4-TNTC     Crystals, Ur, HPF, POC neg     Casts, Ur, LPF, POC neg     Yeast, UA neg      Assessment & Plan:   Abdominal pain, epigastric - Plan: POCT CBC, POCT urinalysis dipstick, POCT UA - Microscopic Only, Comprehensive metabolic  panel, Lipase, US Abdomen Limited RUQ, ondansetron (ZOFRAN-ODT) disintegrating tablet 4 mg Zofran 4 mg SL  x 1 given in clinic as pt could not drink water to provide urine sample due to nausea.  Sent for stat RUQ [place] immediately from office which was normal.  Will wait on additional lab results for further treatment.  DDX still likely for presummed viral gastroenteritis but will r/o cholecystitis w/ [place], and mild pancreatitis or other liver irritation w/ labs.  Pt w/ h/o FSGS (chronic kidney disease) but UA relatively nml (though dirty catch and pt is becoming dehydrated) and renal function pending.  Continue following symptomatically and if not improving RTC, would consider poss gastritis as cause of sxs - trial of ppi or ranitidine vs if sxs/exam worsening may need to consider CT scan and repeat UA.  Importance of pushing fluids reinforced. Meds ordered this encounter  Medications  . ondansetron (ZOFRAN-ODT) disintegrating tablet 4 mg    Sig:     Norberto Sorenson, MD MPH

## 2013-12-08 NOTE — Telephone Encounter (Signed)
Per dr Clelia Croftshaw, informed patient Deborah Gonzales was normal, as of now she will wait until lab results come back, remain on clear liquid diet, if labs come back normal and she is still having pain she will need to be seen in the office for further  evaluation.

## 2013-12-23 ENCOUNTER — Ambulatory Visit (INDEPENDENT_AMBULATORY_CARE_PROVIDER_SITE_OTHER): Payer: BC Managed Care – PPO | Admitting: Internal Medicine

## 2013-12-23 ENCOUNTER — Encounter: Payer: Self-pay | Admitting: Internal Medicine

## 2013-12-23 VITALS — BP 140/83 | HR 108 | Temp 98.0°F | Ht 66.0 in | Wt 358.2 lb

## 2013-12-23 DIAGNOSIS — I10 Essential (primary) hypertension: Secondary | ICD-10-CM

## 2013-12-23 DIAGNOSIS — Z23 Encounter for immunization: Secondary | ICD-10-CM

## 2013-12-23 DIAGNOSIS — Z0000 Encounter for general adult medical examination without abnormal findings: Secondary | ICD-10-CM | POA: Insufficient documentation

## 2013-12-23 NOTE — Patient Instructions (Signed)
1. Please schedule a follow up appointment for ~6 months  2. Please take all medications as prescribed.   3. If you have worsening of your symptoms or new symptoms arise, please call the clinic [phone]), or go to the ER immediately if symptoms are severe.  You have done a great job in taking all your medications. I appreciate it very much. Please continue doing that.

## 2013-12-23 NOTE — Progress Notes (Signed)
Patient ID: Deborah Gonzales, female   DOB: 11/19/1980, 33 y.o.   MRN: [id number]  Subjective:   Patient ID: Deborah Rudlizabeth A Gonzales female   DOB: 10/26/1981 33 y.o.   MRN: [id number]  HPI: Deborah Gonzales is a 33 y.o. female w pmh of FSGS, HTN, morbid obesity, and hyperlipidemia presenting to clinic for routine follow up. Patient has no complaints today. Denies any issues w/ knee pain as previously described, no recent SOB, chest pain, fever, chills, or cough. She was seen in Urgent care on 12/04/13 and 12/08/13 for viral gastroenteritis, symptoms of N/V/D have since resolved. CBC, CMP done at that time, shown to be wnl.  Discussed weight loss and previous discussions of bariatric surgery. Patient says she is not ready for surgery at this time and claims she is feeling better on her own diet and exercise regimen. Offered further resources for weight loss, patient declined.   Past Medical History  Diagnosis Date  . Hyperlipidemia   . Hypertension   . 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 On appropriate therapy with ACE-i  . Severe obesity (BMI >= 40)     BMI 55   Current Outpatient Prescriptions  Medication Sig Dispense Refill  . enalapril (VASOTEC) 20 MG tablet Take 2 tablets (40 mg total) by mouth daily.  180 tablet  3  . hydrochlorothiazide (HYDRODIURIL) 25 MG tablet Take 1 tablet (25 mg total) by mouth daily.  90 tablet  3   No current facility-administered medications for this visit.   Family History  Problem Relation Age of Onset  . Hypertension Mother   . Hypertension Father   . Diabetes Father    History   Social History  . Marital Status: Single    Spouse Name: N/A    Number of Children: N/A  . Years of Education: N/A   Social History Main Topics  . Smoking status: Never Smoker   . Smokeless tobacco: None  . Alcohol Use: No  . Drug Use: None  . Sexual Activity: None   Other Topics Concern  . None    Social History Narrative  . None   Review of Systems: General: Denies fever, chills, diaphoresis, appetite change and fatigue.  Respiratory: Denies SOB, DOE, cough, and wheezing.   Cardiovascular: Denies chest pain, palpitations and leg swelling.  Gastrointestinal: Denies nausea, vomiting, abdominal pain, diarrhea.  Genitourinary: Denies dysuria  Endocrine: Denies sweats, polyuria, polydipsia. Musculoskeletal: Denies myalgias, back pain, joint swelling, arthralgias and gait problem.  Skin: Denies pallor, rash and wounds.  Neurological: Denies dizziness, seizures, weakness, lightheadedness, headaches.   Objective:   Physical Exam: Filed Vitals:   12/23/13 1342  BP: 140/83  Pulse: 108  Temp: 98 F (36.7 C)  TempSrc: Oral  Height: 5\' 6"  (1.676 m)  Weight: 358 lb 3.2 oz (162.478 kg)  SpO2: 99%   Constitutional: Vital signs reviewed. Patient is an obese female in no acute distress and cooperative with exam. Alert and oriented x3.  Head: Normocephalic and atraumatic. Mouth: No erythema or exudates, MMM  Eyes: PERRL, EOMI, conjunctivae normal, No scleral icterus.  Neck: Supple, Trachea midline normal ROM, No JVD, mass, thyromegaly, or carotid bruit present.  Cardiovascular: Heart sounds difficult to auscultate due to body habitus, RRR, S1 normal, S2 normal, no murmurs, gallops or rubs. Pulses symmetric and intact bilaterally. Pulmonary/Chest: CTAB, no wheezes, rales, or rhonchi  Abdominal: Soft. Non-tender, non-distended, bowel sounds are normal, no masses,  organomegaly, or guarding present.  Musculoskeletal: No joint deformities, erythema, or stiffness, ROM full and nontender.  Neurological: A&O x3, cranial nerve II-XII are grossly intact, no focal motor deficit, sensory intact to light touch bilaterally.  Skin: Warm, dry and intact. No rash, cyanosis, or clubbing.  Psychiatric: Normal mood and affect. speech and behavior is normal. Judgment and thought content normal. Cognition  and memory are normal.  Assessment & Plan:   Please see problem-based charting for assessment and plan.

## 2013-12-23 NOTE — Assessment & Plan Note (Signed)
Blood pressure at goal, 140/83. No modifications made at this time. Will have patient return in ~6 months for BP follow up given presence of FSGS.

## 2013-12-23 NOTE — Assessment & Plan Note (Signed)
Received Tdap today.  

## 2013-12-25 NOTE — Progress Notes (Signed)
Case discussed with Dr. Jones at the time of the visit.  We reviewed the resident's history and exam and pertinent patient test results.  I agree with the assessment, diagnosis, and plan of care documented in the resident's note. 

## 2014-02-16 ENCOUNTER — Other Ambulatory Visit: Payer: Self-pay | Admitting: *Deleted

## 2014-02-16 DIAGNOSIS — I10 Essential (primary) hypertension: Secondary | ICD-10-CM

## 2014-02-16 MED ORDER — HYDROCHLOROTHIAZIDE 25 MG PO TABS: 25.0000 mg | ORAL_TABLET | Freq: Every day | ORAL | Status: DC

## 2014-02-16 MED ORDER — ENALAPRIL MALEATE 20 MG PO TABS: 40.0000 mg | ORAL_TABLET | Freq: Every day | ORAL | Status: DC

## 2014-10-19 ENCOUNTER — Encounter: Payer: Self-pay | Admitting: Internal Medicine

## 2014-10-19 ENCOUNTER — Ambulatory Visit (INDEPENDENT_AMBULATORY_CARE_PROVIDER_SITE_OTHER): Payer: BC Managed Care – PPO | Admitting: Internal Medicine

## 2014-10-19 VITALS — BP 121/86 | HR 109 | Temp 98.6°F | Ht 66.0 in | Wt 368.1 lb

## 2014-10-19 DIAGNOSIS — N926 Irregular menstruation, unspecified: Secondary | ICD-10-CM | POA: Insufficient documentation

## 2014-10-19 DIAGNOSIS — I10 Essential (primary) hypertension: Secondary | ICD-10-CM

## 2014-10-19 DIAGNOSIS — Z0000 Encounter for general adult medical examination without abnormal findings: Secondary | ICD-10-CM | POA: Diagnosis not present

## 2014-10-19 LAB — COMPLETE METABOLIC PANEL WITH GFR
ALK PHOS: 72 U/L (ref 39–117)
ALT: 13 U/L (ref 0–35)
AST: 16 U/L (ref 0–37)
Albumin: 3.7 g/dL (ref 3.5–5.2)
BUN: 11 mg/dL (ref 6–23)
CALCIUM: 9.4 mg/dL (ref 8.4–10.5)
CO2: 24 meq/L (ref 19–32)
Chloride: 102 mEq/L (ref 96–112)
Creat: 0.53 mg/dL (ref 0.50–1.10)
GFR, Est African American: >89 mL/min
Glucose, Bld: 86 mg/dL (ref 70–99)
Potassium: 3.8 mEq/L (ref 3.5–5.3)
Sodium: 138 mEq/L (ref 135–145)
Total Bilirubin: 0.6 mg/dL (ref 0.2–1.2)
Total Protein: 7.3 g/dL (ref 6.0–8.3)

## 2014-10-19 LAB — POCT GLYCOSYLATED HEMOGLOBIN (HGB A1C): HEMOGLOBIN A1C: 5.6

## 2014-10-19 LAB — GLUCOSE, CAPILLARY: Glucose-Capillary: 81 mg/dL (ref 70–99)

## 2014-10-19 NOTE — Assessment & Plan Note (Signed)
Declined both flu shot and pap smear today.  -Says she "may" be willing to have pap at next visit.

## 2014-10-19 NOTE — Assessment & Plan Note (Signed)
Weight 368 lbs today, increased ~10 lbs since her last visit. Discussed the importance of weight loss at length, especially w/ respect to low back pain and menstrual irregularities. Says she has been lazy recently and hasn't been walking and eating healthy like she previously had been. -Encouraged diet and calorie counting -Check CMP today; previous abdominal US significant for mild fatty infiltration.  -TSH, FSH, prolactin, HbA1c today

## 2014-10-19 NOTE — Assessment & Plan Note (Signed)
BP Readings from Last 3 Encounters:  10/19/14 121/86  12/23/13 140/83  12/08/13 118/80    Lab Results  Component Value Date   NA 138 12/08/2013   K 4.1 12/08/2013   CREATININE 0.56 12/08/2013    Assessment: Blood pressure control: controlled Progress toward BP goal:  at goal Comments: Taking Enalapril 40 mg daily + HCTZ 25 mg daily. Well controlled. Tachycardic on exam, not a new finding, most likely related to morbid obesity and cardiac demand d/t this. Asymptomatic.   Plan: Medications:  continue current medications Educational resources provided: brochure (has information) Self management tools provided: home blood pressure logbook Other plans: Check CMP today on account of h/o FSGS.  RTC in 3-6 months.

## 2014-10-19 NOTE — Assessment & Plan Note (Signed)
Patient w/ severe obesity and prolonged h/o menstrual irregularity, stating that she has not had menstrual period since October. Suspect is very likely 2/2 morbid obesity and possible PCOS given presence of hirsutism. No previously documented HbA1c. TSH from 2011 0.9.  -Check HbA1c, TSH, FSH, prolactin today.  -Encouraged weight loss as above.

## 2014-10-19 NOTE — Progress Notes (Signed)
   Subjective:   Patient ID: Deborah Gonzales A Deborah Gonzales female   DOB: 09/25/1981 33 y.o.   MRN: [id number]  HPI: Ms. Deborah Gonzales is a 33 y.o. female w/ PMHx of HTN, HLD, h/o FSGS (diagnosed w/ biopsy 1999) and obesity, presents to the clinic today for a follow-up visit. The patient was last seen on 12/23/13 in clinic for follow up for HTN. Since that time, she has been doing well, only mild complaints. 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 Her BP is well controlled today and she states that it is also SBP in the 120-130 range at home also. The patient does state that she has not been exercising recently and has gained ~10 lbs since her last visit. She denies any recent SOB, chest pain, PND, or orthopnea. She does state that she has mild low back pain, more at the end of the day after work. Lastly, she mentions that her periods have been irregular, stating that she had her LMP in October and says that she is not sexually active. She has been irregular in the past.   Past Medical History  Diagnosis Date  . Hyperlipidemia   . Hypertension   . 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 On appropriate therapy with ACE-i  . Severe obesity (BMI >= 40)     BMI 55   Current Outpatient Prescriptions  Medication Sig Dispense Refill  . enalapril (VASOTEC) 20 MG tablet Take 2 tablets (40 mg total) by mouth daily. 180 tablet 3  . hydrochlorothiazide (HYDRODIURIL) 25 MG tablet Take 1 tablet (25 mg total) by mouth daily. 90 tablet 3   No current facility-administered medications for this visit.    Review of Systems: General: Denies fever, chills, diaphoresis, appetite change and fatigue.  Respiratory: Denies SOB, DOE, cough, and wheezing.   Cardiovascular: Denies chest pain and palpitations.  Gastrointestinal: Denies nausea, vomiting, abdominal pain, and diarrhea.  Genitourinary: Denies dysuria, increased frequency,  and flank pain. Endocrine: Positive for menstrual irregularity. Denies hot or cold intolerance, polyuria, and polydipsia. Musculoskeletal: Positive for mild low back pain. Denies myalgias, joint swelling, arthralgias and gait problem.  Skin: Denies pallor, rash and wounds.  Neurological: Denies dizziness, seizures, syncope, weakness, lightheadedness, numbness and headaches.  Psychiatric/Behavioral: Denies mood changes, and sleep disturbances.  Objective:   Physical Exam: Filed Vitals:   10/19/14 1338  BP: 121/86  Pulse: 109  Temp: 98.6 F (37 C)  TempSrc: Oral  Height: 5\' 6"  (1.676 m)  Weight: 368 lb 1.6 oz (166.969 kg)  SpO2: 100%    General: Morbidly obese AA female, alert, cooperative, NAD. Very mild facial twitch.  HEENT: PERRL, EOMI. Moist mucus membranes. Hirsutism.  Neck: Full range of motion without pain, supple, no lymphadenopathy or carotid bruits. Small mobile soft mass (1 cm x 1 cm) on back of neck, non-tender.  Lungs: Clear to ascultation bilaterally, normal work of respiration, no wheezes, rales, rhonchi Heart: Tachycardic, regular rhythm, no murmurs, gallops, or rubs. Abdomen: Soft, non-tender, non-distended, BS + Extremities: No cyanosis, clubbing, or edema Neurologic: Alert & oriented X3, cranial nerves II-XII intact, strength grossly intact, sensation intact to light touch   Assessment & Plan:   Please see problem based assessment and plan.

## 2014-10-19 NOTE — Patient Instructions (Addendum)
General Instructions:  1. Please schedule a follow up appointment for 6 months. At that time we should consider Pap Smear.  Eat healthy diet and exercise!!  2. Please take all medications as prescribed. Continue Enalapril + HCTZ  3. If you have worsening of your symptoms or new symptoms arise, please call the clinic [phone]), or go to the ER immediately if symptoms are severe.  You have done a great job in taking all your medications. I appreciate it very much. Please continue doing that.   Please bring your medicines with you each time you come to clinic.  Medicines may include prescription medications, over-the-counter medications, herbal remedies, eye drops, vitamins, or other pills.   Progress Toward Treatment Goals:  Treatment Goal 10/19/2014  Blood pressure at goal    Self Care Goals & Plans:  Self Care Goal 10/19/2014  Manage my medications take my medicines as prescribed; bring my medications to every visit; refill my medications on time  Monitor my health -  Eat healthy foods drink diet soda or water instead of juice or soda; eat more vegetables; eat foods that are low in salt; eat baked foods instead of fried foods; eat fruit for snacks and desserts  Be physically active -  Meeting treatment goals maintain the current self-care plan    No flowsheet data found.   Care Management & Community Referrals:  Referral 10/19/2014  Referrals made for care management support none needed  Referrals made to community resources none

## 2014-10-20 LAB — PROLACTIN: Prolactin: 10.9 ng/mL

## 2014-10-20 LAB — TSH: TSH: 1.091 u[IU]/mL (ref 0.350–4.500)

## 2014-10-20 LAB — FOLLICLE STIMULATING HORMONE: FSH: 2.1 m[IU]/mL

## 2014-10-24 NOTE — Progress Notes (Signed)
Case discussed with Dr. Yetta BarreJones at time of visit.  We reviewed the resident's history and exam and pertinent patient test results.  I agree with the assessment, diagnosis, and plan of care documented in the resident's note.  If she continues without a menstrual period, we will also check a pregnancy test in addition to the work-up outlined by Dr. Yetta BarreJones.

## 2015-03-04 IMAGING — US US ABDOMEN LIMITED
1 series · 14 of 25 positions shown · non-contrast
Comparison: US ABDOMEN COMPLETE dated 11/14/2011

CLINICAL DATA: Nausea and vomiting, history of hypertension and
severe obesity

EXAM:
US ABDOMEN LIMITED - RIGHT UPPER QUADRANT

[Series 1: us abdomen limited · 0.33mm/px · 14 of 37 slices shown]
[im 1/37]
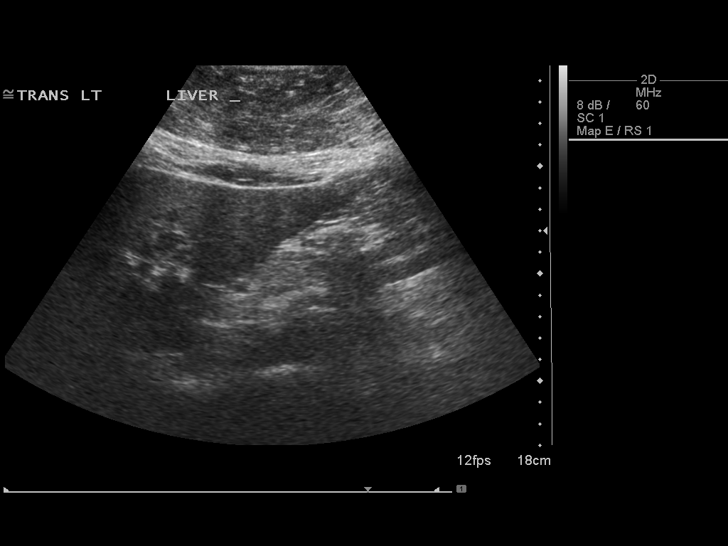
[im 4/37]
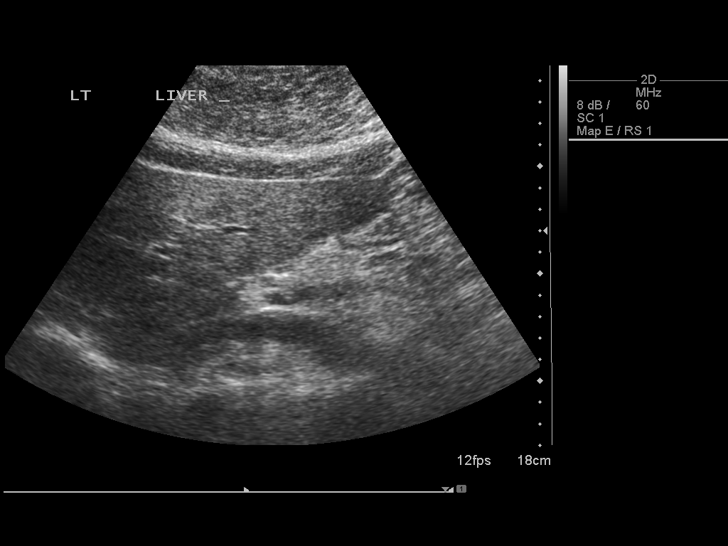
[im 7/37]
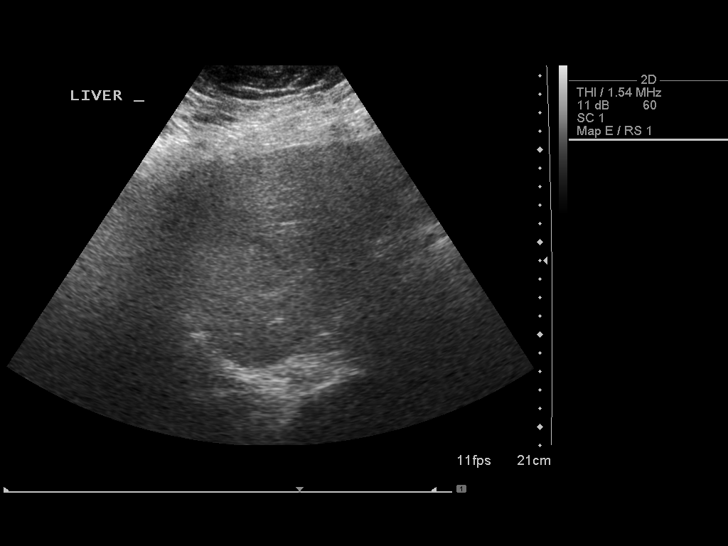
[im 10/37]
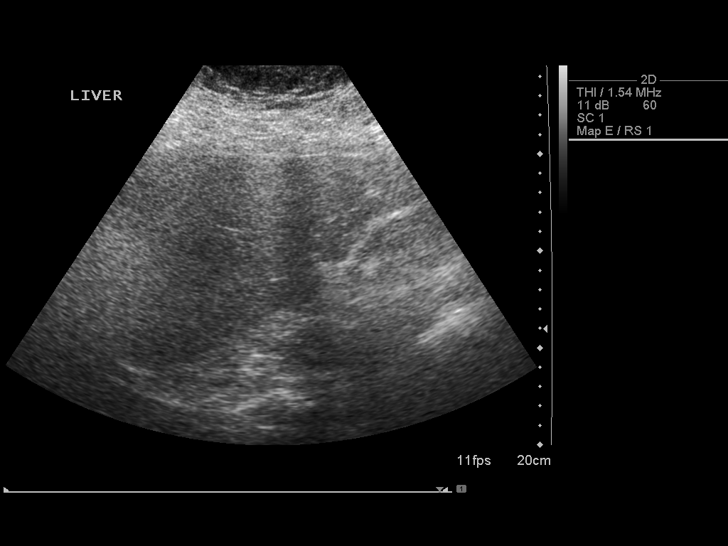
[im 13/37]
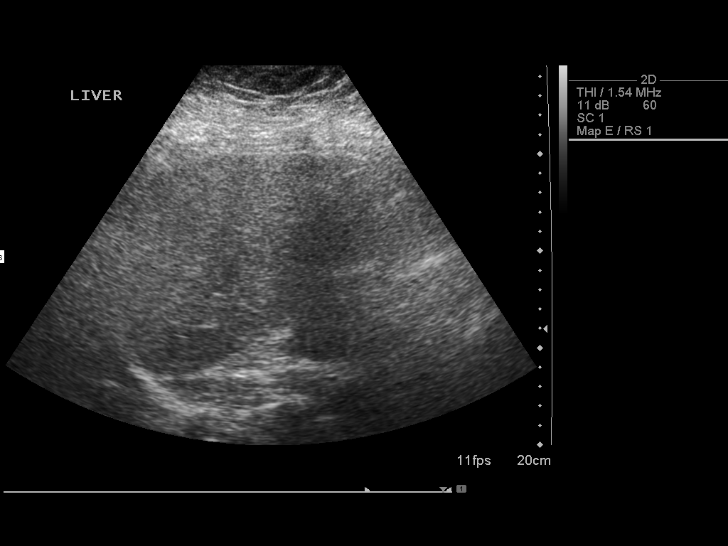
[im 14/37]
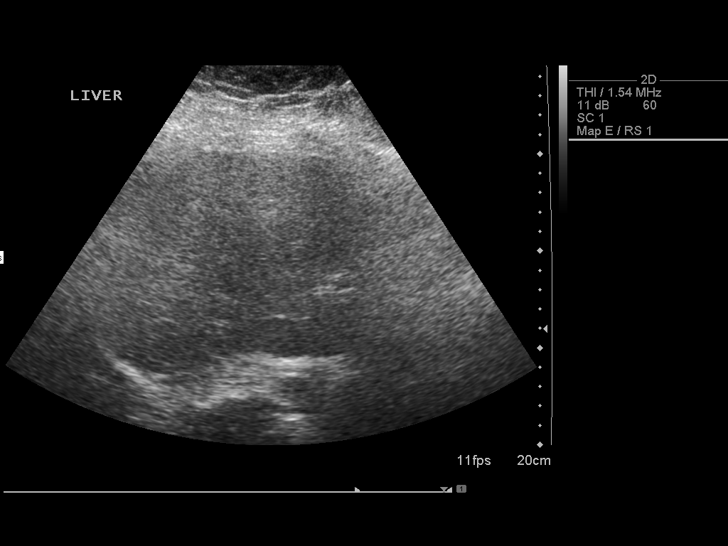
[im 17/37]
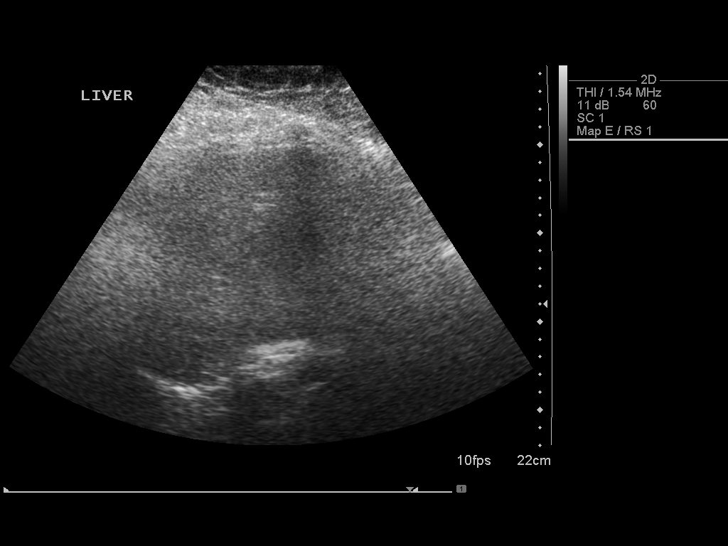
[im 20/37]
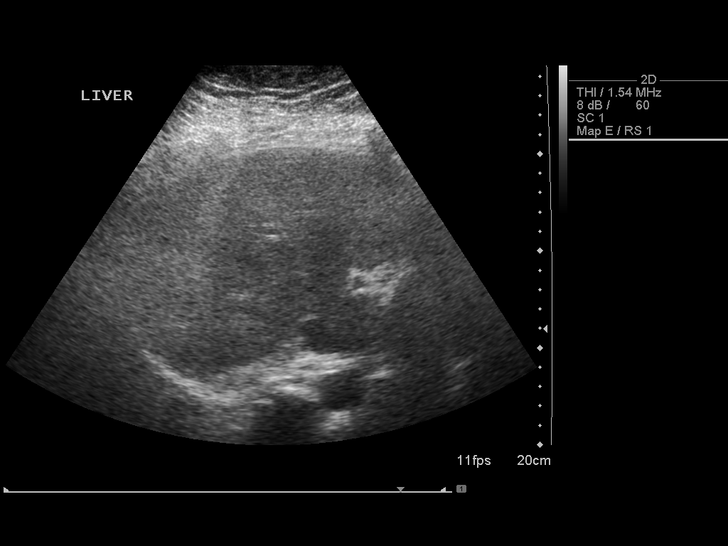
[im 23/37]
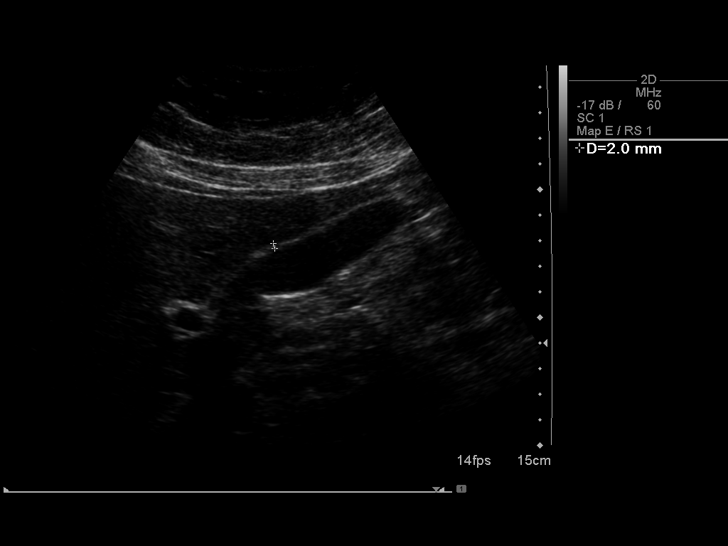
[im 25/37]
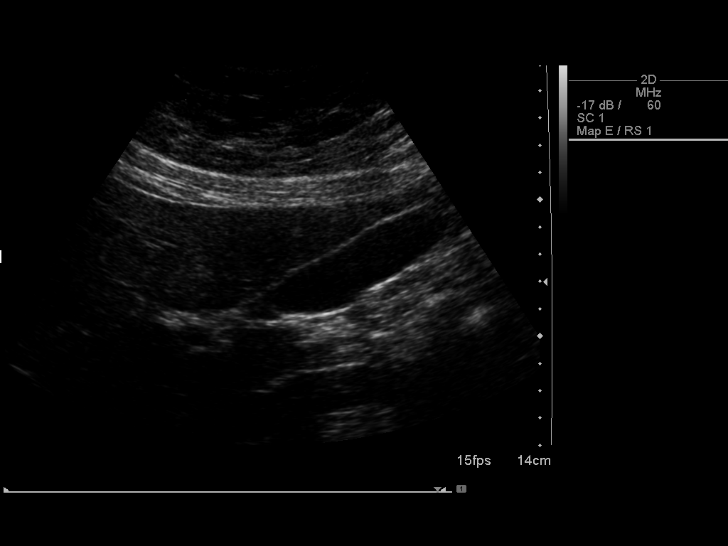
[im 28/37]
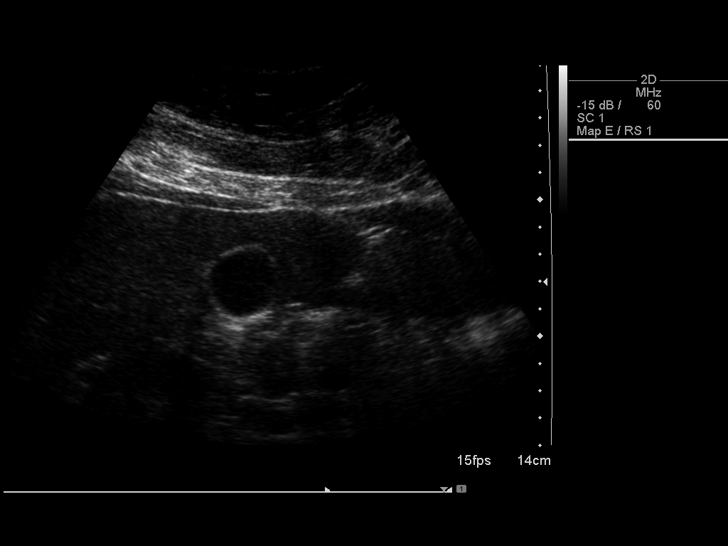
[im 31/37]
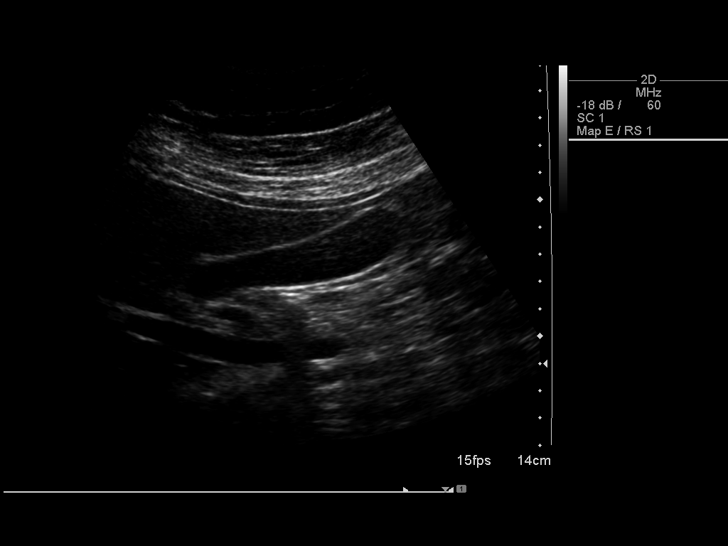
[im 34/37]
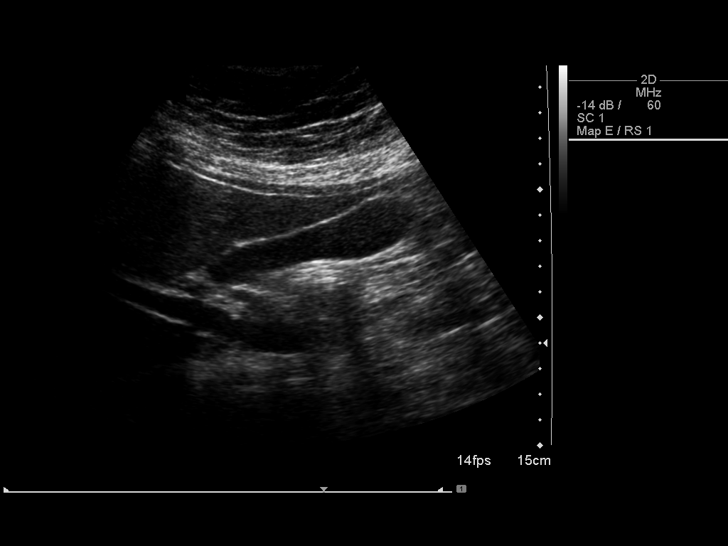
[im 37/37]
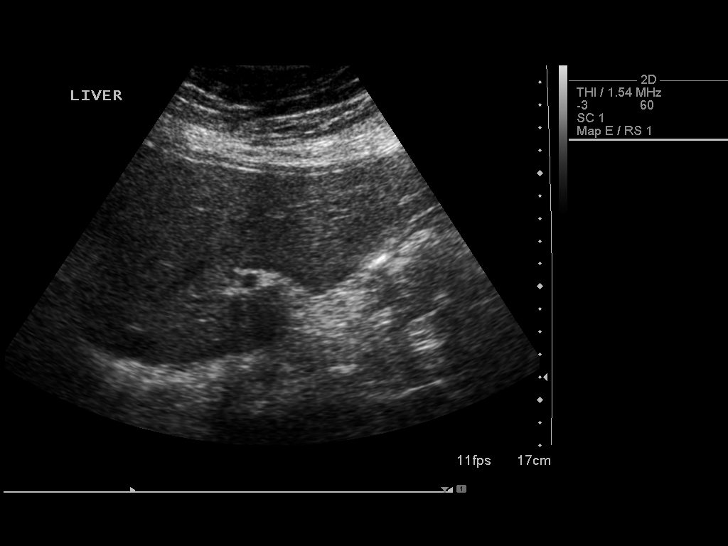

[14 of 25 positions shown; findings below may reference images not displayed]

FINDINGS: Gallbladder:

No gallstones or wall thickening visualized. No sonographic Murphy
sign noted.

Common bile duct:

Diameter: 2.3 mm

Liver:

The liver exhibits mildly increased echotexture diffusely consistent
with fatty infiltrated change.
IMPRESSION: The study is limited due to the patient's body habitus. There is no
definite evidence of abnormality involving the liver or gallbladder
or common bile duct.

These results will be called to the ordering clinician or
representative by the Radiologist Assistant, and communication
documented in the PACS Dashboard. These results will be called to
the ordering clinician or representative by the Radiologist
Assistant, and communication documented in the PACS Dashboard.

## 2015-03-05 ENCOUNTER — Other Ambulatory Visit: Payer: Self-pay | Admitting: *Deleted

## 2015-03-06 MED ORDER — HYDROCHLOROTHIAZIDE 25 MG PO TABS: 25.0000 mg | ORAL_TABLET | Freq: Every day | ORAL | Status: DC

## 2015-03-06 MED ORDER — ENALAPRIL MALEATE 20 MG PO TABS: 40.0000 mg | ORAL_TABLET | Freq: Every day | ORAL | Status: DC

## 2015-03-15 ENCOUNTER — Telehealth: Payer: Self-pay | Admitting: Internal Medicine

## 2015-03-15 NOTE — Telephone Encounter (Signed)
Call to patient to confirm appointment for 03/16/15 at 1:45 lmtcb

## 2015-03-16 ENCOUNTER — Encounter: Payer: Self-pay | Admitting: Internal Medicine

## 2015-03-16 ENCOUNTER — Ambulatory Visit (INDEPENDENT_AMBULATORY_CARE_PROVIDER_SITE_OTHER): Payer: BLUE CROSS/BLUE SHIELD | Admitting: Internal Medicine

## 2015-03-16 DIAGNOSIS — N926 Irregular menstruation, unspecified: Secondary | ICD-10-CM

## 2015-03-16 DIAGNOSIS — I10 Essential (primary) hypertension: Secondary | ICD-10-CM | POA: Diagnosis not present

## 2015-03-16 DIAGNOSIS — Z6841 Body Mass Index (BMI) 40.0 and over, adult: Secondary | ICD-10-CM

## 2015-03-16 NOTE — Assessment & Plan Note (Addendum)
Checked TSH, FSH, prolactin at last visit, all within normal limits. Patient also claims she is no longer having irregular periods.  -Encouraged weight loss.

## 2015-03-16 NOTE — Assessment & Plan Note (Signed)
BP Readings from Last 3 Encounters:  03/16/15 130/61  10/19/14 121/86  12/23/13 140/83    Lab Results  Component Value Date   NA 138 10/19/2014   K 3.8 10/19/2014   CREATININE 0.53 10/19/2014    Assessment: Blood pressure control: controlled Progress toward BP goal:  at goal Comments: Compliant w/ Enalapril 20 mg daily and HCTZ 25 mg daily.   Plan: Medications:  continue current medications Educational resources provided:  (denies) Other plans: RTC in 6 months.

## 2015-03-16 NOTE — Progress Notes (Signed)
   Subjective:   Patient ID: Deborah Gonzales female   DOB: 12/30/1980 34 y.o.   MRN: [id number]  HPI: Deborah Gonzales is a 34 y.o. female w/ PMHx of HTN, HLD, h/o FSGS (diagnosed w/ biopsy 1999) and obesity, presents to the clinic today for a follow-up visit. Patient has no acute complaints, denies any pain. Patient was seen in 10/2014 w/ irregular menstruation at which time several hormones were checked, all shown to be generally within normal limits. Today, she states she is having a regular period. The patient has gained weight since her last visit despite discussing weight loss at length at her last visit. She is still working full time and enjoys her job. No other issues.   Past Medical History  Diagnosis Date  . Hyperlipidemia   . Hypertension   . 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 On appropriate therapy with ACE-i  . Severe obesity (BMI >= 40)     BMI 55   Current Outpatient Prescriptions  Medication Sig Dispense Refill  . enalapril (VASOTEC) 20 MG tablet Take 2 tablets (40 mg total) by mouth daily. 180 tablet 1  . hydrochlorothiazide (HYDRODIURIL) 25 MG tablet Take 1 tablet (25 mg total) by mouth daily. 90 tablet 1   No current facility-administered medications for this visit.   Review of Systems  General: Denies fever, diaphoresis, appetite change, and fatigue.  Respiratory: Denies SOB, cough, and wheezing.   Cardiovascular: Denies chest pain and palpitations.  Gastrointestinal: Denies nausea, vomiting, abdominal pain, and diarrhea Musculoskeletal: Denies myalgias, arthralgias, back pain, and gait problem.  Neurological: Denies dizziness, syncope, weakness, lightheadedness, and headaches.  Psychiatric/Behavioral: Denies mood changes, sleep disturbance, and agitation.   Objective:   Physical Exam: Filed Vitals:   03/16/15 1404  BP: 130/61  Pulse: 105  Temp: 98.5 F (36.9 C)  Weight: 372 lb  (168.738 kg)  SpO2: 100%    General: Morbidly obese AA female, alert, cooperative, NAD.  HEENT: PERRL, EOMI. Moist mucus membranes. Hirsutism.  Neck: Full range of motion without pain, supple, no lymphadenopathy or carotid bruits. Lungs: Clear to ascultation bilaterally, normal work of respiration, no wheezes, rales, rhonchi Heart: Mildly tachycardic, regular rhythm, no murmurs, gallops, or rubs. Abdomen: Soft, non-tender, non-distended, BS + Extremities: No cyanosis, clubbing, or edema Neurologic: Alert & oriented x3, cranial nerves II-XII intact, strength grossly intact, sensation intact to light touch   Assessment & Plan:   Please see problem based assessment and plan.

## 2015-03-16 NOTE — Patient Instructions (Addendum)
General Instructions:  1. Please schedule follow up appointment for 6 months.   2. Please take all medications as previously prescribed.  3. If you have worsening of your symptoms or new symptoms arise, please call the clinic [phone]), or go to the ER immediately if symptoms are severe.  You have done a great job in taking all your medications. Please continue to do this.  Please bring your medicines with you each time you come to clinic.  Medicines may include prescription medications, over-the-counter medications, herbal remedies, eye drops, vitamins, or other pills.   Progress Toward Treatment Goals:  Treatment Goal 03/16/2015  Blood pressure at goal    Self Care Goals & Plans:  Self Care Goal 03/16/2015  Manage my medications take my medicines as prescribed; refill my medications on time  Monitor my health -  Eat healthy foods drink diet soda or water instead of juice or soda; eat more vegetables; eat fruit for snacks and desserts  Be physically active find an activity I enjoy; find time in my schedule; take a walk every day; park at the far end of the parking lot; take the stairs instead of the elevator; join a walking program  Meeting treatment goals maintain the current self-care plan    No flowsheet data found.   Care Management & Community Referrals:  Referral 03/16/2015  Referrals made for care management support none needed  Referrals made to community resources none

## 2015-03-16 NOTE — Progress Notes (Signed)
Internal Medicine Clinic Attending  Case discussed with Dr. Jones at the time of the visit.  We reviewed the resident's history and exam and pertinent patient test results.  I agree with the assessment, diagnosis, and plan of care documented in the resident's note.  

## 2015-03-16 NOTE — Assessment & Plan Note (Signed)
Patient weight now 372 lbs, increased from 368 lbs at last clinic visit. She has no significant complaints. We once again discussed the importance of weight loss w/ diet and exercise. We discussed trying a weight loss program such as [facility], patient says she will look into this but still does not seem terribly interested. Is not interested in Bariatric surgery consultation.  -RTC in 6 months for further discussion regarding weight loss.

## 2015-05-25 ENCOUNTER — Telehealth: Payer: Self-pay

## 2015-05-25 NOTE — Telephone Encounter (Signed)
Patient called in wanted a referral to see a neurologist for old injury she had,told patient i would need to get in touch with Dr. Yetta BarreJones.

## 2015-05-26 NOTE — Telephone Encounter (Signed)
Call to patient to schedule appt lmtcb

## 2015-05-26 NOTE — Telephone Encounter (Signed)
Patient scheduled for 05/28/15 at 9:30 with Dr Johnny BridgeSaraiya.

## 2015-05-26 NOTE — Telephone Encounter (Signed)
Patient needs morning appointment with any resident for her hands

## 2015-05-28 ENCOUNTER — Ambulatory Visit (INDEPENDENT_AMBULATORY_CARE_PROVIDER_SITE_OTHER): Payer: BLUE CROSS/BLUE SHIELD | Admitting: Internal Medicine

## 2015-05-28 ENCOUNTER — Encounter: Payer: Self-pay | Admitting: Internal Medicine

## 2015-05-28 ENCOUNTER — Ambulatory Visit (HOSPITAL_COMMUNITY)
Admission: RE | Admit: 2015-05-28 | Discharge: 2015-05-28 | Disposition: A | Discharge status: Home / Self Care | Payer: BLUE CROSS/BLUE SHIELD | Source: Ambulatory Visit | Attending: Internal Medicine | Admitting: Internal Medicine

## 2015-05-28 DIAGNOSIS — Z8742 Personal history of other diseases of the female genital tract: Secondary | ICD-10-CM

## 2015-05-28 DIAGNOSIS — N051 Unspecified nephritic syndrome with focal and segmental glomerular lesions: Secondary | ICD-10-CM

## 2015-05-28 DIAGNOSIS — I10 Essential (primary) hypertension: Secondary | ICD-10-CM | POA: Diagnosis not present

## 2015-05-28 DIAGNOSIS — R200 Anesthesia of skin: Secondary | ICD-10-CM | POA: Diagnosis not present

## 2015-05-28 DIAGNOSIS — M47894 Other spondylosis, thoracic region: Secondary | ICD-10-CM | POA: Diagnosis not present

## 2015-05-28 DIAGNOSIS — R202 Paresthesia of skin: Secondary | ICD-10-CM

## 2015-05-28 DIAGNOSIS — M549 Dorsalgia, unspecified: Secondary | ICD-10-CM | POA: Insufficient documentation

## 2015-05-28 LAB — BASIC METABOLIC PANEL WITH GFR
BUN: 9 mg/dL (ref 6–23)
CO2: 26 mEq/L (ref 19–32)
CREATININE: 0.51 mg/dL (ref 0.50–1.10)
Calcium: 9.1 mg/dL (ref 8.4–10.5)
Chloride: 103 mEq/L (ref 96–112)
Glucose, Bld: 89 mg/dL (ref 70–99)
Potassium: 3.8 mEq/L (ref 3.5–5.3)
Sodium: 139 mEq/L (ref 135–145)

## 2015-05-28 LAB — CBC
HCT: 32.2 % — ABNORMAL LOW (ref 36.0–46.0)
HEMOGLOBIN: 10.1 g/dL — AB (ref 12.0–15.0)
MCH: 22.1 pg — ABNORMAL LOW (ref 26.0–34.0)
MCHC: 31.4 g/dL (ref 30.0–36.0)
MCV: 70.6 fL — ABNORMAL LOW (ref 78.0–100.0)
MPV: 8.7 fL (ref 8.6–12.4)
PLATELETS: 459 10*3/uL — AB (ref 150–400)
RBC: 4.56 MIL/uL (ref 3.87–5.11)
RDW: 18.5 % — AB (ref 11.5–15.5)
WBC: 7.7 10*3/uL (ref 4.0–10.5)

## 2015-05-28 LAB — VITAMIN B12: Vitamin B-12: 390 pg/mL (ref 211–911)

## 2015-05-28 NOTE — Patient Instructions (Addendum)
Thank you for your visit today We have ordered x rays of your neck and spine and bloodwork.  Please follow up once they are done.  General Instructions:   Please bring your medicines with you each time you come to clinic.  Medicines may include prescription medications, over-the-counter medications, herbal remedies, eye drops, vitamins, or other pills.   Progress Toward Treatment Goals:  Treatment Goal 03/16/2015  Blood pressure at goal    Self Care Goals & Plans:  Self Care Goal 03/16/2015  Manage my medications take my medicines as prescribed; refill my medications on time  Monitor my health -  Eat healthy foods drink diet soda or water instead of juice or soda; eat more vegetables; eat fruit for snacks and desserts  Be physically active find an activity I enjoy; find time in my schedule; take a walk every day; park at the far end of the parking lot; take the stairs instead of the elevator; join a walking program  Meeting treatment goals maintain the current self-care plan    No flowsheet data found.   Care Management & Community Referrals:  Referral 03/16/2015  Referrals made for care management support none needed  Referrals made to community resources none

## 2015-05-28 NOTE — Assessment & Plan Note (Signed)
BP Readings from Last 3 Encounters:  05/28/15 160/89  03/16/15 130/61  10/19/14 121/86   Pt's BP was 160/89 today but she has not taken her meds this morning. Continue current regimen and advised her to not miss any doses

## 2015-05-28 NOTE — Assessment & Plan Note (Signed)
She had history of amenorrhea in the past, but she reports regular periods at this visit.

## 2015-05-28 NOTE — Assessment & Plan Note (Signed)
Pt has gained over 30 pounds in the last 3 years. 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 She was also told to go to bariatric surgery for opinion at some point in the past, but wished not to do that, but she will think about it.  Counseled her on lifestyle changes and the effects of obesity. Previous workup like TSH has been WNL

## 2015-05-28 NOTE — Assessment & Plan Note (Addendum)
Pt says that she has been having numbness and tingling on both hands and arm for the past year, and it has gotten worse. She says that she had some kind of injury in 2006 and her "back gave out", and it has been there ever since.  No alcohol use. She has not lifted anything recently and no recent exertion or injury. No weakness in arms and legs. The paresthesias are confined to hands only and are not present in the leg. Last A1c 5.6-  6 months back Full neurologic exam was performed and it was entirely benign   Her paresthesias are of unclear etiology Plan: CBC, BMP, B12, HIV, and xrays of C spine and t spine. Follow up once the results are in for further management  We may also refer her to EMG for nerve conduction studies depending on the above results

## 2015-05-28 NOTE — Progress Notes (Signed)
Patient ID: Deborah Gonzales, female   DOB: 10/10/1981, 34 y.o.   MRN: [id number]   Subjective:   Patient ID: Deborah Rudlizabeth A Gonzales female   DOB: 06/17/1981 34 y.o.   MRN: [id number]  HPI: Deborah Gonzales is a 34 y.o. woman who is here for a chief complaint of tingling and numbness in both of her hands and arm for a year now.  SHe has notable PMH of FSGS, HLD, HTN, and morbid obesity.      Past Medical History  Diagnosis Date  . Hyperlipidemia   . Hypertension   . 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 On appropriate therapy with ACE-i  . Severe obesity (BMI >= 40)     BMI 55   Current Outpatient Prescriptions  Medication Sig Dispense Refill  . enalapril (VASOTEC) 20 MG tablet Take 2 tablets (40 mg total) by mouth daily. 180 tablet 1  . hydrochlorothiazide (HYDRODIURIL) 25 MG tablet Take 1 tablet (25 mg total) by mouth daily. 90 tablet 1   No current facility-administered medications for this visit.   Family History  Problem Relation Age of Onset  . Hypertension Mother   . Hypertension Father   . Diabetes Father    History   Social History  . Marital Status: Single    Spouse Name: N/A  . Number of Children: N/A  . Years of Education: N/A   Social History Main Topics  . Smoking status: Never Smoker   . Smokeless tobacco: Not on file  . Alcohol Use: No  . Drug Use: Not on file  . Sexual Activity: Not on file   Other Topics Concern  . None   Social History Narrative   Review of Systems: Review of Systems  Constitutional: Negative.   Eyes: Negative for blurred vision and double vision.  Respiratory: Negative.  Negative for shortness of breath and wheezing.   Cardiovascular: Negative for orthopnea and leg swelling.  Musculoskeletal: Positive for back pain and neck pain.  Neurological: Positive for tingling. Negative for dizziness and focal weakness.  Psychiatric/Behavioral: Negative for  substance abuse.    Objective:  Physical Exam: Filed Vitals:   05/28/15 0936  BP: 160/89  Pulse: 100  Temp: 98.7 F (37.1 C)  TempSrc: Oral  Weight: 374 lb 4.8 oz (169.781 kg)  SpO2: 100%   Physical Exam  Constitutional: She appears well-developed and well-nourished.  Neck: Normal range of motion. Neck supple.  Cardiovascular: Normal rate, regular rhythm and intact distal pulses.   No murmur heard. Pulmonary/Chest: Effort normal and breath sounds normal. No respiratory distress.  Extremities: pulses intact b/l, no edema, clubbing or cyanosis, no calf tenderness Neurologic:  CN II-XII grossly intact Sensory: intact to light tough and pinprick in all extremities Motor: 5/5 strength in upper, 5/5 in lower extremities, no pronator drift Cerebellar: gait normal Psyc: pleasant personality, affect appropriate to mood, no focal deficits    Assessment & Plan:  Please see problem based charting for assessment and plan

## 2015-05-29 LAB — HIV ANTIBODY (ROUTINE TESTING W REFLEX): HIV 1&2 Ab, 4th Generation: NONREACTIVE

## 2015-05-31 ENCOUNTER — Telehealth: Payer: Self-pay | Admitting: Internal Medicine

## 2015-05-31 NOTE — Progress Notes (Signed)
Internal Medicine Clinic Attending  I saw and evaluated the patient.  I personally confirmed the key portions of the history and exam documented by Dr. Saraiya and I reviewed pertinent patient test results.  The assessment, diagnosis, and plan were formulated together and I agree with the documentation in the resident's note.  

## 2015-05-31 NOTE — Telephone Encounter (Addendum)
Call to patient to confirm appointment for 06/01/15 at 8:30 no answer

## 2015-06-01 ENCOUNTER — Ambulatory Visit (INDEPENDENT_AMBULATORY_CARE_PROVIDER_SITE_OTHER): Payer: BLUE CROSS/BLUE SHIELD | Admitting: Internal Medicine

## 2015-06-01 ENCOUNTER — Encounter: Payer: Self-pay | Admitting: Internal Medicine

## 2015-06-01 VITALS — BP 128/80 | HR 106 | Temp 98.6°F | Ht 66.0 in | Wt 369.4 lb

## 2015-06-01 DIAGNOSIS — D508 Other iron deficiency anemias: Secondary | ICD-10-CM

## 2015-06-01 DIAGNOSIS — R739 Hyperglycemia, unspecified: Secondary | ICD-10-CM

## 2015-06-01 DIAGNOSIS — R200 Anesthesia of skin: Secondary | ICD-10-CM

## 2015-06-01 DIAGNOSIS — Z6841 Body Mass Index (BMI) 40.0 and over, adult: Secondary | ICD-10-CM

## 2015-06-01 DIAGNOSIS — R202 Paresthesia of skin: Secondary | ICD-10-CM

## 2015-06-01 DIAGNOSIS — D649 Anemia, unspecified: Secondary | ICD-10-CM | POA: Diagnosis not present

## 2015-06-01 DIAGNOSIS — Z833 Family history of diabetes mellitus: Secondary | ICD-10-CM

## 2015-06-01 DIAGNOSIS — D509 Iron deficiency anemia, unspecified: Secondary | ICD-10-CM

## 2015-06-01 DIAGNOSIS — I10 Essential (primary) hypertension: Secondary | ICD-10-CM

## 2015-06-01 LAB — FOLATE

## 2015-06-01 LAB — FERRITIN: Ferritin: 11 ng/mL (ref 10–291)

## 2015-06-01 LAB — POCT GLYCOSYLATED HEMOGLOBIN (HGB A1C): Hemoglobin A1C: 5.7

## 2015-06-01 LAB — IRON AND TIBC
%SAT: 5 % — AB (ref 20–55)
Iron: 22 ug/dL — ABNORMAL LOW (ref 42–145)
TIBC: 403 ug/dL (ref 250–470)
UIBC: 381 ug/dL (ref 125–400)

## 2015-06-01 LAB — RETICULOCYTES
ABS Retic: 92.4 10*3/uL (ref 19.0–186.0)
RBC.: 4.62 MIL/uL (ref 3.87–5.11)
Retic Ct Pct: 2 % (ref 0.4–2.3)

## 2015-06-01 LAB — GLUCOSE, CAPILLARY: GLUCOSE-CAPILLARY: 82 mg/dL (ref 65–99)

## 2015-06-01 MED ORDER — FERROUS SULFATE 325 (65 FE) MG PO TABS: 325.0000 mg | ORAL_TABLET | Freq: Every day | ORAL | Status: DC

## 2015-06-01 NOTE — Progress Notes (Signed)
Internal Medicine Clinic Attending  I saw and evaluated the patient.  I personally confirmed the key portions of the history and exam documented by Dr. Saraiya and I reviewed pertinent patient test results.  The assessment, diagnosis, and plan were formulated together and I agree with the documentation in the resident's note.  

## 2015-06-01 NOTE — Patient Instructions (Signed)
It was a pleasure to see you in the office.  We have ordered some more blood work to figure out the cause of your low hemoglobin.  Please start taking iron supplements that I have prescribed.  Please follow up in 1 month.  I will call you if the blood work results are concerning  I have ordered the nerve test. The clinic will call you to set up an appointment time and day per your convenience.  Iron Deficiency Anemia Anemia is a condition in which there are less red blood cells or hemoglobin in the blood than normal. Hemoglobin is the part of red blood cells that carries oxygen. Iron deficiency anemia is anemia caused by too little iron. It is the most common type of anemia. It may leave you tired and short of breath. CAUSES   Lack of iron in the diet.  Poor absorption of iron, as seen with intestinal disorders.  Intestinal bleeding.  Heavy periods. SIGNS AND SYMPTOMS  Mild anemia may not be noticeable. Symptoms may include:  Fatigue.  Headache.  Pale skin.  Weakness.  Tiredness.  Shortness of breath.  Dizziness.  Cold hands and feet.  Fast or irregular heartbeat. DIAGNOSIS  Diagnosis requires a thorough evaluation and physical exam by your health care provider. Blood tests are generally used to confirm iron deficiency anemia. Additional tests may be done to find the underlying cause of your anemia. These may include:  Testing for blood in the stool (fecal occult blood test).  A procedure to see inside the colon and rectum (colonoscopy).  A procedure to see inside the esophagus and stomach (endoscopy). TREATMENT  Iron deficiency anemia is treated by correcting the cause of the deficiency. Treatment may involve:  Adding iron-rich foods to your diet.  Taking iron supplements. Pregnant or breastfeeding women need to take extra iron because their normal diet usually does not provide the required amount.  Taking vitamins. Vitamin C improves the absorption of iron.  Your health care provider may recommend that you take your iron tablets with a glass of orange juice or vitamin C supplement.  Medicines to make heavy menstrual flow lighter.  Surgery. HOME CARE INSTRUCTIONS   Take iron as directed by your health care provider.  If you cannot tolerate taking iron supplements by mouth, talk to your health care provider about taking them through a vein (intravenously) or an injection into a muscle.  For the best iron absorption, iron supplements should be taken on an empty stomach. If you cannot tolerate them on an empty stomach, you may need to take them with food.  Do not drink milk or take antacids at the same time as your iron supplements. Milk and antacids may interfere with the absorption of iron.  Iron supplements can cause constipation. Make sure to include fiber in your diet to prevent constipation. A stool softener may also be recommended.  Take vitamins as directed by your health care provider.  Eat a diet rich in iron. Foods high in iron include liver, lean beef, whole-grain bread, eggs, dried fruit, and dark green leafy vegetables. SEEK IMMEDIATE MEDICAL CARE IF:   You faint. If this happens, do not drive. Call your local emergency services (911 in [place] if no other help is available.  You have chest pain.  You feel nauseous or vomit.  You have severe or increased shortness of breath with activity.  You feel weak.  You have a rapid heartbeat.  You have unexplained sweating.  You become light-headed  when getting up from a chair or bed. MAKE SURE YOU:   Understand these instructions.  Will watch your condition.  Will get help right away if you are not doing well or get worse. Document Released: 10/27/2000 Document Revised: 11/04/2013 Document Reviewed: 07/07/2013 [facility] Patient Information 2015 [place], [place]. This information is not intended to replace advice given to you by your health care provider. Make sure you discuss  any questions you have with your health care provider.

## 2015-06-01 NOTE — Assessment & Plan Note (Signed)
Present for few months. 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 Not affecting the feet.  Neuro exam entirely unremarkable.  Ordered EMGs and will f/u in a month

## 2015-06-01 NOTE — Assessment & Plan Note (Signed)
Well controlled on current regimen, continue current meds BP Readings from Last 3 Encounters:  06/01/15 128/80  05/28/15 160/89  03/16/15 130/61

## 2015-06-01 NOTE — Assessment & Plan Note (Addendum)
Other HPI: Patients says that she has no heavy periods, and they are regular, lasting about 6 days a month.  She experiences no dizziness, or change in mood or behavior. Other constitutional symptoms negative. No SOB, no blood in stool, no palpitations. Only pertinent finding is the numbness in her hands.   She says that she lives in an apartment, which is about 36 years old. 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 No exposure to lead or other toxins.  A/P Her CBC from last week showed microcytic anemia, as the Hgb was 10.1 and MCV was 70.6 I think most likely it is iron deficiency anemia, thought the acute drop in MCV over the year makes us ponder about other causes too. We have ordered the iron studies, ferritin level, retic count, periph smear, and folate level, and lead level,  to investigate further  If the results are unrevealing, may order FOBT  Prescribed ferrous sulfate 325 mg to be taken daily  Will call pt if the results are concerning F/u in a month      CBC    Component Value Date/Time   WBC 7.7 05/28/2015 1033   WBC 5.8 12/08/2013 0839   RBC 4.56 05/28/2015 1033   RBC 4.68 12/08/2013 0839   HGB 10.1* 05/28/2015 1033   HGB 12.8 12/08/2013 0839   HCT 32.2* 05/28/2015 1033   HCT 40.7 12/08/2013 0839   PLT 459* 05/28/2015 1033   MCV 70.6* 05/28/2015 1033   MCV 87.0 12/08/2013 0839   MCH 22.1* 05/28/2015 1033   MCH 27.4 12/08/2013 0839   MCHC 31.4 05/28/2015 1033   MCHC 31.4* 12/08/2013 0839   RDW 18.5* 05/28/2015 1033   LYMPHSABS 1.9 11/13/2011 2313   MONOABS 0.7 11/13/2011 2313   EOSABS 0.1 11/13/2011 2313   BASOSABS 0.0 11/13/2011 2313

## 2015-06-01 NOTE — Assessment & Plan Note (Signed)
Ordered A1c, as she has a lot of risk factors for development of t2dm, with a strong FH of diabetes. She also has metabolic syndrome.

## 2015-06-01 NOTE — Progress Notes (Signed)
Patient ID: Deborah Gonzales, female   DOB: 06/08/1981, 34 y.o.   MRN: [id number]   Subjective:   Patient ID: Deborah Gonzales female   DOB: 03/10/1981 34 y.o.   MRN: [id number]  HPI: Deborah Gonzales is a 34 y.o. woman who is here for a follow up from last week. SHe is here to discuss the lab and imaging results from last week. Notable PMH of FSGS dx via biopsy, HTN, and HLD with morbid obesity.      Past Medical History  Diagnosis Date  . Hyperlipidemia   . Hypertension   . 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 On appropriate therapy with ACE-i  . Severe obesity (BMI >= 40)     BMI 55   Current Outpatient Prescriptions  Medication Sig Dispense Refill  . enalapril (VASOTEC) 20 MG tablet Take 2 tablets (40 mg total) by mouth daily. 180 tablet 1  . hydrochlorothiazide (HYDRODIURIL) 25 MG tablet Take 1 tablet (25 mg total) by mouth daily. 90 tablet 1   No current facility-administered medications for this visit.   Family History  Problem Relation Age of Onset  . Hypertension Mother   . Hypertension Father   . Diabetes Father    History   Social History  . Marital Status: Single    Spouse Name: N/A  . Number of Children: N/A  . Years of Education: N/A   Social History Main Topics  . Smoking status: Never Smoker   . Smokeless tobacco: Not on file  . Alcohol Use: No  . Drug Use: Not on file  . Sexual Activity: Not on file   Other Topics Concern  . None   Social History Narrative   Review of Systems:  Review of Systems  Constitutional: Negative for fever, chills, weight loss and malaise/fatigue.  Eyes: Negative for blurred vision.  Respiratory: Negative.  Negative for cough and shortness of breath.   Cardiovascular: Negative for chest pain, palpitations and leg swelling.  Gastrointestinal: Negative for heartburn, nausea, vomiting, abdominal pain, diarrhea, constipation, blood in stool and  melena.  Musculoskeletal: Positive for back pain.       Back pain getting better from last week  Neurological: Positive for tingling. Negative for dizziness, weakness and headaches.       Tingling and numbness of hands  Psychiatric/Behavioral: The patient has insomnia. The patient is not nervous/anxious.      Objective:  Physical Exam: Filed Vitals:   06/01/15 0845  BP: 128/80  Pulse: 106  Temp: 98.6 F (37 C)  TempSrc: Oral  Height: 5\' 6"  (1.676 m)  Weight: 369 lb 6.4 oz (167.559 kg)  SpO2: 100%   Physical Exam  Constitutional: She is oriented to person, place, and time. She appears well-developed.  Morbidly obese  Neck: Neck supple. No thyromegaly present.  Cardiovascular: Normal rate, regular rhythm, normal heart sounds and intact distal pulses.   No murmur heard. Pulmonary/Chest: Effort normal and breath sounds normal. She has no wheezes.  Abdominal: Soft. Bowel sounds are normal. She exhibits no distension. There is no tenderness.  Neurological: She is alert and oriented to person, place, and time. No cranial nerve deficit.  Full neuro exam was performed at the last visit and was normal   Psychiatric: She has a normal mood and affect.    Assessment & Plan:   Please see problem based charting for assessment and plan

## 2015-06-02 LAB — PATHOLOGIST SMEAR REVIEW

## 2015-06-03 LAB — HEAVY METALS, BLOOD
Lead: <2 ug/dL (ref <10)
Mercury, B: <4 mcg/L (ref <=10)

## 2015-06-24 ENCOUNTER — Encounter: Payer: Self-pay | Admitting: Neurology

## 2015-06-24 ENCOUNTER — Ambulatory Visit (INDEPENDENT_AMBULATORY_CARE_PROVIDER_SITE_OTHER): Payer: Self-pay | Admitting: Neurology

## 2015-06-24 ENCOUNTER — Ambulatory Visit (INDEPENDENT_AMBULATORY_CARE_PROVIDER_SITE_OTHER): Payer: BLUE CROSS/BLUE SHIELD | Admitting: Neurology

## 2015-06-24 DIAGNOSIS — R202 Paresthesia of skin: Secondary | ICD-10-CM

## 2015-06-24 DIAGNOSIS — M25529 Pain in unspecified elbow: Secondary | ICD-10-CM

## 2015-06-24 NOTE — Procedures (Signed)
     HISTORY:  Deborah Gonzales is a 34 year old patient with a one-year history of bilateral arm discomfort in the forearms mainly, right greater than left. She reports some numbness in the hands as well, she denies any neck or shoulder discomfort. The patient is being evaluated for a possible neuropathy or a cervical radiculopathy.  NERVE CONDUCTION STUDIES:  Nerve conduction studies were performed on both upper extremities. The distal motor latencies and motor amplitudes for the median and ulnar nerves were within normal limits. The F wave latencies and nerve conduction velocities for these nerves were also normal. The sensory latencies for the median and ulnar nerves were normal.   EMG STUDIES:  EMG study was performed on the right upper extremity:  The first dorsal interosseous muscle reveals 2 to 4 K units with full recruitment. No fibrillations or positive waves were noted. The abductor pollicis brevis muscle reveals 2 to 4 K units with full recruitment. No fibrillations or positive waves were noted. The extensor indicis proprius muscle reveals 1 to 3 K units with full recruitment. No fibrillations or positive waves were noted. The pronator teres muscle reveals 2 to 3 K units with full recruitment. No fibrillations or positive waves were noted. The biceps muscle reveals 1 to 2 K units with full recruitment. No fibrillations or positive waves were noted. The triceps muscle reveals 2 to 4 K units with full recruitment. No fibrillations or positive waves were noted. The anterior deltoid muscle reveals 2 to 3 K units with full recruitment. No fibrillations or positive waves were noted. The cervical paraspinal muscles were tested at 2 levels. No abnormalities of insertional activity were seen at either level tested. There was good relaxation.  EMG study was performed on the left upper extremity:  The first dorsal interosseous muscle reveals 2 to 4 K units with full recruitment. No  fibrillations or positive waves were noted. The abductor pollicis brevis muscle reveals 2 to 4 K units with full recruitment. No fibrillations or positive waves were noted. The extensor indicis proprius muscle reveals 1 to 3 K units with full recruitment. No fibrillations or positive waves were noted. The pronator teres muscle reveals 2 to 3 K units with full recruitment. No fibrillations or positive waves were noted. The biceps muscle reveals 1 to 2 K units with full recruitment. No fibrillations or positive waves were noted. The triceps muscle reveals 2 to 4 K units with full recruitment. No fibrillations or positive waves were noted. The anterior deltoid muscle reveals 2 to 3 K units with full recruitment. No fibrillations or positive waves were noted. The cervical paraspinal muscles were tested at 2 levels. No abnormalities of insertional activity were seen at either level tested. There was good relaxation.   IMPRESSION:  Nerve conduction studies done on both upper extremities were within normal limits. No evidence of a neuropathy is seen. EMG evaluations on both upper extremities were within normal limits, no evidence of a cervical radiculopathy is noted on either side.  Marlan Palau MD 06/24/2015 12:00 PM  [facility] [address] [place], [place] [address]  Phone [phone] Fax [phone]

## 2015-06-24 NOTE — Progress Notes (Signed)
Please refer to EMG and nerve conduction study procedure note. 

## 2015-09-12 ENCOUNTER — Other Ambulatory Visit: Payer: Self-pay | Admitting: Internal Medicine

## 2015-11-14 ENCOUNTER — Other Ambulatory Visit: Payer: Self-pay | Admitting: Internal Medicine

## 2015-11-26 ENCOUNTER — Ambulatory Visit (INDEPENDENT_AMBULATORY_CARE_PROVIDER_SITE_OTHER): Payer: BLUE CROSS/BLUE SHIELD | Admitting: Family Medicine

## 2015-11-26 VITALS — BP 102/74 | HR 107 | Temp 98.2°F | Resp 18 | Ht 64.5 in | Wt 362.0 lb

## 2015-11-26 DIAGNOSIS — N032 Chronic nephritic syndrome with diffuse membranous glomerulonephritis: Secondary | ICD-10-CM

## 2015-11-26 DIAGNOSIS — R7303 Prediabetes: Secondary | ICD-10-CM | POA: Diagnosis not present

## 2015-11-26 DIAGNOSIS — E669 Obesity, unspecified: Secondary | ICD-10-CM | POA: Diagnosis not present

## 2015-11-26 DIAGNOSIS — D649 Anemia, unspecified: Secondary | ICD-10-CM

## 2015-11-26 DIAGNOSIS — Z131 Encounter for screening for diabetes mellitus: Secondary | ICD-10-CM

## 2015-11-26 DIAGNOSIS — R202 Paresthesia of skin: Secondary | ICD-10-CM | POA: Diagnosis not present

## 2015-11-26 DIAGNOSIS — D509 Iron deficiency anemia, unspecified: Secondary | ICD-10-CM | POA: Diagnosis not present

## 2015-11-26 DIAGNOSIS — N051 Unspecified nephritic syndrome with focal and segmental glomerular lesions: Secondary | ICD-10-CM

## 2015-11-26 LAB — POCT CBC
GRANULOCYTE PERCENT: 63.2 % (ref 37–80)
HEMATOCRIT: 36.7 % — AB (ref 37.7–47.9)
HEMOGLOBIN: 11.8 g/dL — AB (ref 12.2–16.2)
LYMPH, POC: 1.7 (ref 0.6–3.4)
MCH, POC: 24.9 pg — AB (ref 27–31.2)
MCHC: 32.1 g/dL (ref 31.8–35.4)
MCV: 77.5 fL — AB (ref 80–97)
MID (cbc): 0.3 (ref 0–0.9)
MPV: 7.4 fL (ref 0–99.8)
PLATELET COUNT, POC: 395 10*3/uL (ref 142–424)
POC GRANULOCYTE: 3.5 (ref 2–6.9)
POC LYMPH PERCENT: 30.9 %L (ref 10–50)
POC MID %: 5.9 %M (ref 0–12)
RBC: 4.74 M/uL (ref 4.04–5.48)
RDW, POC: 16.8 %
WBC: 5.5 10*3/uL (ref 4.6–10.2)

## 2015-11-26 LAB — POCT GLYCOSYLATED HEMOGLOBIN (HGB A1C): Hemoglobin A1C: 5.8

## 2015-11-26 LAB — GLUCOSE, POCT (MANUAL RESULT ENTRY): POC GLUCOSE: 93 mg/dL (ref 70–99)

## 2015-11-26 LAB — TSH: TSH: 0.743 u[IU]/mL (ref 0.350–4.500)

## 2015-11-26 NOTE — Progress Notes (Addendum)
Subjective:  By signing my name below, I, Raven Small, attest that this documentation has been prepared under the direction and in the presence of Meredith Staggers, MD.  Electronically Signed: Andrew Au, ED Scribe. 11/26/2015. 8:46 AM.  Patient ID: Deborah Gonzales, female    DOB: 10/21/1981, 35 y.o.   MRN: [id number]  HPI Chief Complaint  Patient presents with  . Numbness    both arms and fingers, painful, for multiple months   HPI Comments: Deborah Gonzales is a 35 y.o. female with hx of obesity, HTN, HLD and focal segmental glomerulosclerosis who presents to the [facility] complaining of numbness is bilateral arms and fingers. On review of her problem list she had numbness in tingling in hand, evaluated by Dr. Johnny Bridge July 2016. At that time she had normal B12 level. 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  Referred for EMG and nerve conduction studies. Nerve conduction study was normal 06/24/15. EMG studies were also normal. No evidence of neuropathy or radiculopathy. She had an A1C of 5.7 in 05/2015. Was anemic with iron deff. She had a normal heavy metal blood test, normal folate, normal BMP and was not having neck pain at that time. Normal TSH of 10/2014.   Pt states pain is similar as before, but worsened about 1 month ago. She describes pain as an intermittent burning sensation in upper arms bilaterally with tingling sensation radiating down to lower arms and hands followed by numbness. Symptoms tend to worsen at work with lifting objects. She also reports occasional decreased grip strength with symptoms. She has tried taking tylenol.  She took iron initially for 1 month but stopped due to only having 1 prescription. She denies new medications in the past years. She denies symptoms in LE. No neck pain.  She denies hx of DM. Pt works as a Lawyer at [employer]. She denies working with power tool and heavy equipment.   Patient Active Problem List   Diagnosis Date Noted  . Anemia  06/01/2015  . Numbness and tingling in hands 05/28/2015  . Irregular menstruation 10/19/2014  . Health care maintenance 12/23/2013  . FSGS (focal segmental glomerulosclerosis) 07/02/2012  . HYPERLIPIDEMIA 10/25/2006  . Severe obesity (BMI >= 40) (HCC) 10/25/2006  . Essential hypertension 10/25/2006   Past Medical History  Diagnosis Date  . Hyperlipidemia   . Hypertension   . 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 On appropriate therapy with ACE-i  . Severe obesity (BMI >= 40) (HCC)     BMI 55   History reviewed. No pertinent past surgical history. No Known Allergies Prior to Admission medications   Medication Sig Start Date End Date Taking? Authorizing Provider  enalapril (VASOTEC) 20 MG tablet TAKE TWO TABLETS BY MOUTH DAILY 09/13/15  Yes Courtney Paris, MD  ferrous sulfate 325 (65 FE) MG tablet Take 1 tablet (325 mg total) by mouth daily. 06/01/15 05/31/16 Yes Deneise Lever, MD  hydrochlorothiazide (HYDRODIURIL) 25 MG tablet TAKE ONE TABLET BY MOUTH ONCE DAILY 11/16/15  Yes Courtney Paris, MD   Social History   Social History  . Marital Status: Single    Spouse Name: N/A  . Number of Children: N/A  . Years of Education: N/A   Occupational History  . Not on file.   Social History Main Topics  . Smoking status: Never Smoker   . Smokeless tobacco: Not on file  . Alcohol Use: No  .  Drug Use: Not on file  . Sexual Activity: Not on file   Other Topics Concern  . Not on file   Social History Narrative   Review of Systems  Musculoskeletal: Positive for myalgias. Negative for joint swelling, arthralgias, neck pain and neck stiffness.  Neurological: Positive for tremors, weakness and numbness.     Objective:   Physical Exam  Constitutional: She is oriented to person, place, and time. She appears well-developed and well-nourished. No distress.  Obese  HENT:  Head: Normocephalic and atraumatic.  Eyes:  Conjunctivae and EOM are normal.  Neck: Neck supple.  Cardiovascular: Normal rate, regular rhythm and normal heart sounds.  Exam reveals no gallop and no friction rub.   No murmur heard. Pulmonary/Chest: Effort normal and breath sounds normal. No respiratory distress. She has no wheezes. She has no rales. She exhibits no tenderness.  Musculoskeletal: Normal range of motion.  Diffuse discomfort along forearm. No boney tenderness. Diffuse slight discomfort in fingertips.  Same exam on left hand.  Strength intact bilateral upper arms. Grip strength equal.  Full ROM of cspine, does not reproduce symptoms.   Neurological: She is alert and oriented to person, place, and time. She displays a negative Romberg sign.  Reflex Scores:      Bicep reflexes are 2+ on the right side and 2+ on the left side.      Brachioradialis reflexes are 2+ on the right side and 2+ on the left side. Slight intension tremor on right hand. No pronator drift. Heel to toe walk is normal. Normal finger to nose. Some difficulty obtain triceps reflex   Skin: Skin is warm and dry.  Psychiatric: She has a normal mood and affect. Her behavior is normal.  Nursing note and vitals reviewed.  Filed Vitals:   11/26/15 0823  BP: 102/74  Pulse: 107  Temp: 98.2 F (36.8 C)  Resp: 18  Height: 5' 4.5" (1.638 m)  Weight: 362 lb (164.202 kg)  SpO2: 96%   Wt Readings from Last 3 Encounters:  11/26/15 362 lb (164.202 kg)  06/01/15 369 lb 6.4 oz (167.559 kg)  05/28/15 374 lb 4.8 oz (169.781 kg)    Results for orders placed or performed in visit on 11/26/15  POCT CBC  Result Value Ref Range   WBC 5.5 4.6 - 10.2 K/uL   Lymph, poc 1.7 0.6 - 3.4   POC LYMPH PERCENT 30.9 10 - 50 %L   MID (cbc) 0.3 0 - 0.9   POC MID % 5.9 0 - 12 %M   POC Granulocyte 3.5 2 - 6.9   Granulocyte percent 63.2 37 - 80 %G   RBC 4.74 4.04 - 5.48 M/uL   Hemoglobin 11.8 (A) 12.2 - 16.2 g/dL   HCT, POC 16.136.7 (A) 09.637.7 - 47.9 %   MCV 77.5 (A) 80 - 97 fL    MCH, POC 24.9 (A) 27 - 31.2 pg   MCHC 32.1 31.8 - 35.4 g/dL   RDW, POC 04.516.8 %   Platelet Count, POC 395 142 - 424 K/uL   MPV 7.4 0 - 99.8 fL  POCT glucose (manual entry)  Result Value Ref Range   POC Glucose 93 70 - 99 mg/dl  POCT glycosylated hemoglobin (Hb A1C)  Result Value Ref Range   Hemoglobin A1C 5.8    Assessment & Plan:  Deborah Rudlizabeth A Faraj is a 35 y.o. female Anemia, unspecified anemia type, Iron deficiency anemia - Plan: POCT CBC, IBC Panel  -  Improved, but still anemic.  Start over-the-counter iron once per day. Iron studies pending. Recheck levels in 6-8 weeks.  Paresthesia of both hands - Plan: POCT CBC, POCT glucose (manual entry), POCT glycosylated hemoglobin (Hb A1C), TSH, Ambulatory referral to Neurology  -  Prior workup reviewed. Normal NCS and EMG, previous heavy metal screening and B12/folate testing appeared to be normal. She is prediabetic, but doubt neuropathy from hyperglycemia. Also reports intermittent discomfort in her forearms, but primarily dysesthesias into the hands diffusely bilaterally.  -WIll have her evaluated by neurology to determine other workup or possible treatment.  - Can take Tylenol as needed for now for the myalgias, avoid NSAIDs with history of FSGS. RTC precautions if worsening prior to neuro eval.  Obesity - Plan: POCT glucose (manual entry), POCT glycosylated hemoglobin (Hb A1C) Screening for diabetes mellitus - Plan: POCT glucose (manual entry), POCT glycosylated hemoglobin (Hb A1C) Prediabetes   -   weight loss was discussed, prediabetes and natural course to diabetes was reviewed.  Follow-up with primary care provider to discuss further.  FSGS (focal segmental glomerulosclerosis) - Plan: Basic metabolic panel  - BMP pending. Has appointment scheduled with her nephrologist shortly. Advised her to discuss with her nephrologist her upper extremity symptoms, though unlikely related.  Avoid NSAIDs for upper extremity pain due to kidney  disease.   No orders of the defined types were placed in this encounter.   Patient Instructions  Your diabetes test is in "prediabetes range" but unlikely the cause of your numbness and tingling in the arms. Continue to work on weight loss to help lessen chance of this proceeding on to diabetes. Hemoglobin (anemia) is also slightly low, but improved from previous reading. Take over-the-counter iron once per day. Recheck levels in the next 6-8 weeks.  I did send off some tests for thyroid, electrolytes, kidney function and will have those results to you within the next week to 10 days. Okay to continue Tylenol for now up to 4 times per day, and I will refer you to neurology for further evaluation of your symptoms. If they are worsening prior to that visit, return for recheck and discussion of other medications in the meantime.      I personally performed the services described in this documentation, which was scribed in my presence. The recorded information has been reviewed and considered, and addended by me as needed.

## 2015-11-26 NOTE — Patient Instructions (Signed)
Your diabetes test is in "prediabetes range" but unlikely the cause of your numbness and tingling in the arms. Continue to work on weight loss to help lessen chance of this proceeding on to diabetes. Hemoglobin (anemia) is also slightly low, but improved from previous reading. Take over-the-counter iron once per day. Recheck levels in the next 6-8 weeks.  I did send off some tests for thyroid, electrolytes, kidney function and will have those results to you within the next week to 10 days. Okay to continue Tylenol for now up to 4 times per day, and I will refer you to neurology for further evaluation of your symptoms. If they are worsening prior to that visit, return for recheck and discussion of other medications in the meantime.

## 2015-11-30 LAB — IBC PANEL
%SAT: 8 % — ABNORMAL LOW (ref 11–50)
TIBC: 369 ug/dL (ref 250–450)
UIBC: 339 ug/dL (ref 125–400)

## 2015-11-30 LAB — BASIC METABOLIC PANEL
BUN: 9 mg/dL (ref 7–25)
CHLORIDE: 102 mmol/L (ref 98–110)
CO2: 28 mmol/L (ref 20–31)
Calcium: 9.8 mg/dL (ref 8.6–10.2)
Creat: 0.48 mg/dL — ABNORMAL LOW (ref 0.50–1.10)
GLUCOSE: 80 mg/dL (ref 65–99)
POTASSIUM: 3.7 mmol/L (ref 3.5–5.3)
Sodium: 140 mmol/L (ref 135–146)

## 2015-11-30 LAB — IRON: IRON: 30 ug/dL — AB (ref 40–190)

## 2015-12-13 ENCOUNTER — Encounter: Payer: Self-pay | Admitting: *Deleted

## 2015-12-15 ENCOUNTER — Ambulatory Visit: Payer: BLUE CROSS/BLUE SHIELD | Admitting: Neurology

## 2015-12-20 ENCOUNTER — Ambulatory Visit (INDEPENDENT_AMBULATORY_CARE_PROVIDER_SITE_OTHER): Payer: BLUE CROSS/BLUE SHIELD | Admitting: Neurology

## 2015-12-20 ENCOUNTER — Encounter: Payer: Self-pay | Admitting: Neurology

## 2015-12-20 VITALS — BP 153/88 | HR 108 | Ht 66.0 in | Wt 366.5 lb

## 2015-12-20 DIAGNOSIS — R202 Paresthesia of skin: Secondary | ICD-10-CM | POA: Diagnosis not present

## 2015-12-20 DIAGNOSIS — R251 Tremor, unspecified: Secondary | ICD-10-CM

## 2015-12-20 NOTE — Patient Instructions (Signed)
Paresthesia Paresthesia is an abnormal burning or prickling sensation. This sensation is generally felt in the hands, arms, legs, or feet. However, it may occur in any part of the body. Usually, it is not painful. The feeling may be described as:  Tingling or numbness.  Pins and needles.  Skin crawling.  Buzzing.  Limbs falling asleep.  Itching. Most people experience temporary (transient) paresthesia at some time in their lives. Paresthesia may occur when you breathe too quickly (hyperventilation). It can also occur without any apparent cause. Commonly, paresthesia occurs when pressure is placed on a nerve. The sensation quickly goes away after the pressure is removed. For some people, however, paresthesia is a long-lasting (chronic) condition that is caused by an underlying disorder. If you continue to have paresthesia, you may need further medical evaluation. HOME CARE INSTRUCTIONS Watch your condition for any changes. Taking the following actions may help to lessen any discomfort that you are feeling:  Avoid drinking alcohol.  Try acupuncture or massage to help relieve your symptoms.  Keep all follow-up visits as directed by your health care provider. This is important. SEEK MEDICAL CARE IF:  You continue to have episodes of paresthesia.  Your burning or prickling feeling gets worse when you walk.  You have pain, cramps, or dizziness.  You develop a rash. SEEK IMMEDIATE MEDICAL CARE IF:  You feel weak.  You have trouble walking or moving.  You have problems with speech, understanding, or vision.  You feel confused.  You cannot control your bladder or bowel movements.  You have numbness after an injury.  You faint.   This information is not intended to replace advice given to you by your health care provider. Make sure you discuss any questions you have with your health care provider.   Document Released: 10/20/2002 Document Revised: 03/16/2015 Document Reviewed:  10/26/2014 Elsevier Interactive Patient Education 2016 [employer]  

## 2015-12-20 NOTE — Progress Notes (Signed)
Reason for visit: Bilateral arm discomfort  Referring physician: Dr. Laqueta Carina is a 35 y.o. female  History of present illness:  Ms. Rowzee is a 35 year old right-handed black female with a history of morbid obesity. The patient reports bilateral upper extremity discomfort and paresthesias dating back at least 18 months. The patient was seen in August 2016 with a one-year history of symptoms at that time, nerve conduction studies of both arms and EMG evaluation on both arms were unremarkable. No evidence of a neuropathy or a cervical radiculopathy was seen. The patient has had persistence of symptoms. She relates these symptoms to her job which requires lifting. The patient indicates that when she does not lift her symptoms seem to improve some. The patient has also reported tremors in both arms, right greater than left over the last 2 months. The patient denies any neck or shoulder discomfort. She denies any symptoms with neck pain, back pain, balance problems or difficulty with bowel or bladder control. There are no symptoms down the arms associated with head or neck movement. The patient returns to this office for an evaluation.  Past Medical History  Diagnosis Date  . Hyperlipidemia   . Hypertension   . 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 On appropriate therapy with ACE-i  . Severe obesity (BMI >= 40) (HCC)     BMI 55    Past Surgical History  Procedure Laterality Date  . Renal biopsy  1999    Family History  Problem Relation Age of Onset  . Hypertension Mother   . Hypertension Father   . Diabetes Father     Social history:  reports that she has never smoked. She has never used smokeless tobacco. She reports that she does not drink alcohol or use illicit drugs.  Medications:  Prior to Admission medications   Medication Sig Start Date End Date Taking? Authorizing Provider  enalapril  (VASOTEC) 20 MG tablet TAKE TWO TABLETS BY MOUTH DAILY 09/13/15  Yes Courtney Paris, MD  ferrous sulfate 325 (65 FE) MG tablet Take 1 tablet (325 mg total) by mouth daily. 06/01/15 05/31/16 Yes Deneise Lever, MD  hydrochlorothiazide (HYDRODIURIL) 25 MG tablet TAKE ONE TABLET BY MOUTH ONCE DAILY 11/16/15  Yes Courtney Paris, MD     No Known Allergies  ROS:  Out of a complete 14 system review of symptoms, the patient complains only of the following symptoms, and all other reviewed systems are negative.  Achy muscles Numbness, weakness  Blood pressure 153/88, pulse 108, height  (1.676 m), weight 366 lb 8 oz (166.243 kg), last menstrual period 11/25/2015.  Physical Exam  General: The patient is alert and cooperative at the time of the examination. The patient is morbidly obese.  Eyes: Pupils are equal, round, and reactive to light. Discs are flat bilaterally.  Neck: The neck is supple, no carotid bruits are noted.  Respiratory: The respiratory examination is clear.  Cardiovascular: The cardiovascular examination reveals a regular rate and rhythm, no obvious murmurs or rubs are noted.  Skin: Extremities are without significant edema.  Neurologic Exam  Mental status: The patient is alert and oriented x 3 at the time of the examination. The patient has apparent normal recent and remote memory, with an apparently normal attention span and concentration ability.  Cranial nerves: Facial symmetry is present. There is good sensation of the face to pinprick and soft touch  bilaterally. The strength of the facial muscles and the muscles to head turning and shoulder shrug are normal bilaterally. Speech is well enunciated, no aphasia or dysarthria is noted. Extraocular movements are full. Visual fields are full. The tongue is midline, and the patient has symmetric elevation of the soft palate. No obvious hearing deficits are noted.  Motor: The motor testing reveals 5 over 5 strength of all 4  extremities. Good symmetric motor tone is noted throughout.  Sensory: Sensory testing is intact to pinprick, soft touch, vibration sensation, and position sense on all 4 extremities. No evidence of extinction is noted.  Coordination: Cerebellar testing reveals good finger-nose-finger and heel-to-shin bilaterally. There appears to be an intermittent tremor affecting the right greater than left upper extremity as an action tremor. The tremors appear to be distractible.  Gait and station: Gait is normal. Tandem gait is normal. Romberg is negative. No drift is seen.  Reflexes: Deep tendon reflexes are symmetric and normal bilaterally. Toes are downgoing bilaterally.   Assessment/Plan:  1. Reported bilateral upper extremity discomfort and paresthesias  2. Tremors, both upper extremities, possibly factitious  The patient is having ongoing complaints of numbness, and discomfort of both forearms and hands. Prior EMG and nerve conduction study evaluation was unremarkable. The patient will be set up for MRI of the cervical spine. She has had some blood work that includes a vitamin B12 level, HIV panel, hemoglobin A1c which were unremarkable. The patient may require further blood work in the future depending upon the results of the MRI.  Marlan Palau MD 12/20/2015 5:13 PM  [facility] [address] [place], [place] [address]  Phone [phone] Fax [phone]

## 2015-12-21 ENCOUNTER — Ambulatory Visit (INDEPENDENT_AMBULATORY_CARE_PROVIDER_SITE_OTHER): Payer: BLUE CROSS/BLUE SHIELD | Admitting: Internal Medicine

## 2015-12-21 ENCOUNTER — Encounter: Payer: Self-pay | Admitting: Internal Medicine

## 2015-12-21 DIAGNOSIS — I10 Essential (primary) hypertension: Secondary | ICD-10-CM | POA: Diagnosis not present

## 2015-12-21 DIAGNOSIS — R202 Paresthesia of skin: Secondary | ICD-10-CM

## 2015-12-21 DIAGNOSIS — Z6841 Body Mass Index (BMI) 40.0 and over, adult: Secondary | ICD-10-CM

## 2015-12-21 DIAGNOSIS — D649 Anemia, unspecified: Secondary | ICD-10-CM | POA: Diagnosis not present

## 2015-12-21 DIAGNOSIS — D509 Iron deficiency anemia, unspecified: Secondary | ICD-10-CM

## 2015-12-21 NOTE — Progress Notes (Signed)
   Subjective:   Patient ID: Deborah Gonzales female   DOB: Jan 30, 1981 35 y.o.   MRN: [id number]  HPI: Ms. Deborah Gonzales is a 35 y.o. female w/ PMHx of HTN, HLD, h/o FSGS (diagnosed w/ biopsy 1999) and obesity, presents to the clinic today for a follow-up visit. Patient states she is not having any issues at this time. Working at her same job, has no complaints. Having regular periods, has been taking BP medications and iron pill regularly.    Current Outpatient Prescriptions  Medication Sig Dispense Refill  . enalapril (VASOTEC) 20 MG tablet TAKE TWO TABLETS BY MOUTH DAILY 180 tablet 1  . ferrous sulfate 325 (65 FE) MG tablet Take 1 tablet (325 mg total) by mouth daily. 30 tablet 3  . hydrochlorothiazide (HYDRODIURIL) 25 MG tablet TAKE ONE TABLET BY MOUTH ONCE DAILY 90 tablet 1   No current facility-administered medications for this visit.   Review of Systems  General: Denies fever, diaphoresis, appetite change, and fatigue.  Respiratory: Denies SOB, cough, and wheezing.   Cardiovascular: Denies chest pain and palpitations.  Gastrointestinal: Denies nausea, vomiting, abdominal pain, and diarrhea Musculoskeletal: Denies myalgias, arthralgias, back pain, and gait problem.  Neurological: Denies dizziness, syncope, weakness, lightheadedness, and headaches.  Psychiatric/Behavioral: Denies mood changes, sleep disturbance, and agitation.   Objective:   Physical Exam: Filed Vitals:   12/21/15 1604  BP: 131/84  Pulse: 98  Temp: 98.2 F (36.8 C)  TempSrc: Oral  Height:  (1.676 m)  Weight: 365 lb 3.2 oz (165.654 kg)  SpO2: 100%    General: Morbidly obese AA female, alert, cooperative, NAD.  HEENT: PERRL, EOMI. Moist mucus membranes. Hirsutism.  Neck: Full range of motion without pain, supple, no lymphadenopathy or carotid bruits. Lungs: Clear to ascultation bilaterally, normal work of respiration, no wheezes, rales, rhonchi Heart: Mildly tachycardic, regular rhythm, no  murmurs, gallops, or rubs. Abdomen: Soft, non-tender, non-distended, BS + Extremities: No cyanosis, clubbing, or edema Neurologic: Alert & oriented x3, cranial nerves II-XII intact, strength grossly intact, sensation intact to light touch   Assessment & Plan:   Please see problem based assessment and plan.

## 2015-12-21 NOTE — Patient Instructions (Signed)
1. Please make a follow up appointment for 6 months.   2. Please take all medications as previously prescribed.  3. If you have worsening of your symptoms or new symptoms arise, please call the clinic ([phone]), or go to the ER immediately if symptoms are severe.  You have done a great job in taking all your medications. Please continue to do this.    Cervical Radiculopathy Cervical radiculopathy happens when a nerve in the neck (cervical nerve) is pinched or bruised. This condition can develop because of an injury or as part of the normal aging process. Pressure on the cervical nerves can cause pain or numbness that runs from the neck all the way down into the arm and fingers. Usually, this condition gets better with rest. Treatment may be needed if the condition does not improve.  CAUSES This condition may be caused by:  Injury.  Slipped (herniated) disk.  Muscle tightness in the neck because of overuse.  Arthritis.  Breakdown or degeneration in the bones and joints of the spine (spondylosis) due to aging.  Bone spurs that may develop near the cervical nerves. SYMPTOMS Symptoms of this condition include:  Pain that runs from the neck to the arm and hand. The pain can be severe or irritating. It may be worse when the neck is moved.  Numbness or weakness in the affected arm and hand. DIAGNOSIS This condition may be diagnosed based on symptoms, medical history, and a physical exam. You may also have tests, including:  X-rays.  CT scan.  MRI.  Electromyogram (EMG).  Nerve conduction tests. TREATMENT In many cases, treatment is not needed for this condition. With rest, the condition usually gets better over time. If treatment is needed, options may include:  Wearing a soft neck collar for short periods of time.  Physical therapy to strengthen your neck muscles.  Medicines, such as NSAIDs, oral corticosteroids, or spinal injections.  Surgery. This may be needed if  other treatments do not help. Various types of surgery may be done depending on the cause of your problems. HOME CARE INSTRUCTIONS Managing Pain  Take over-the-counter and prescription medicines only as told by your health care provider.  If directed, apply ice to the affected area.  Put ice in a plastic bag.  Place a towel between your skin and the bag.  Leave the ice on for 20 minutes, 2-3 times per day.  If ice does not help, you can try using heat. Take a warm shower or warm bath, or use a heat pack as told by your health care provider.  Try a gentle neck and shoulder massage to help relieve symptoms. Activity  Rest as needed. Follow instructions from your health care provider about any restrictions on activities.  Do stretching and strengthening exercises as told by your health care provider or physical therapist. General Instructions  If you were given a soft collar, wear it as told by your health care provider.  Use a flat pillow when you sleep.  Keep all follow-up visits as told by your health care provider. This is important. SEEK MEDICAL CARE IF:  Your condition does not improve with treatment. SEEK IMMEDIATE MEDICAL CARE IF:  Your pain gets much worse and cannot be controlled with medicines.  You have weakness or numbness in your hand, arm, face, or leg.  You have a high fever.  You have a stiff, rigid neck.  You lose control of your bowels or your bladder (have incontinence).  You have trouble  with walking, balance, or speaking.   This information is not intended to replace advice given to you by your health care provider. Make sure you discuss any questions you have with your health care provider.   Document Released: 07/25/2001 Document Revised: 07/21/2015 Document Reviewed: 12/24/2014 Elsevier Interactive Patient Education [employer].

## 2015-12-22 NOTE — Assessment & Plan Note (Signed)
Discussed weight loss most of her visit. Finally feels like she would like to lose weight. Discussed diet and exercise. Not interested in bariatric options at this time. Explained importance of eating small, well balanced meals and walking at least 30 minuets daily to get HR up and sweat. She will try and be better about this.

## 2015-12-22 NOTE — Assessment & Plan Note (Signed)
Suspect this is related to cervical radiculopathy. States it starts in her shoulders bilaterally and radiated into her finger tips. Has had normal EMG studies, is scheduled for cervical MRI. Follows with Dr. Anne Hahn.

## 2015-12-22 NOTE — Assessment & Plan Note (Signed)
BP Readings from Last 3 Encounters:  12/21/15 131/84  12/20/15 153/88  11/26/15 102/74    Lab Results  Component Value Date   NA 140 11/26/2015   K 3.7 11/26/2015   CREATININE 0.48* 11/26/2015    Assessment: Blood pressure control:  Controlled Comments: Compliant with HCTZ 25 + Enalapril 20  Plan: Medications:  continue current medications Educational resources provided:   Self management tools provided:   Other plans: RTC in 6 months

## 2015-12-22 NOTE — Assessment & Plan Note (Signed)
Mildly improved Hb last check. Continue Ferrous Sulfate 325 mg daily.

## 2015-12-27 NOTE — Progress Notes (Signed)
Internal Medicine Clinic Attending  Case discussed with Dr. Jones at the time of the visit.  We reviewed the resident's history and exam and pertinent patient test results.  I agree with the assessment, diagnosis, and plan of care documented in the resident's note.  

## 2015-12-28 ENCOUNTER — Ambulatory Visit
Admission: RE | Admit: 2015-12-28 | Discharge: 2015-12-28 | Disposition: A | Discharge status: Home / Self Care | Payer: BLUE CROSS/BLUE SHIELD | Source: Ambulatory Visit | Attending: Neurology | Admitting: Neurology

## 2015-12-28 DIAGNOSIS — R202 Paresthesia of skin: Secondary | ICD-10-CM

## 2015-12-28 DIAGNOSIS — R251 Tremor, unspecified: Secondary | ICD-10-CM

## 2015-12-29 ENCOUNTER — Telehealth: Payer: Self-pay | Admitting: Neurology

## 2015-12-29 NOTE — Telephone Encounter (Signed)
I called patient. There is a minor disc bulge off of the left, no evidence of cord compression, nothing to explain bilateral arm paresthesias. I have recommended a trial on gabapentin, the patient does not wish to do this at this time. She denies any significant neck or shoulder discomfort. Nerve conduction study and EMG evaluation done previously as been unremarkable.   MRI cervical 12/29/15:  IMPRESSION: 1. At C3-4 there is mild left foraminal disc protrusion with moderate left foraminal stenosis.

## 2016-01-04 ENCOUNTER — Ambulatory Visit (INDEPENDENT_AMBULATORY_CARE_PROVIDER_SITE_OTHER): Payer: BLUE CROSS/BLUE SHIELD | Admitting: Internal Medicine

## 2016-01-04 VITALS — BP 144/96 | HR 95 | Temp 98.9°F | Resp 18 | Ht 66.0 in | Wt 367.4 lb

## 2016-01-04 DIAGNOSIS — J0100 Acute maxillary sinusitis, unspecified: Secondary | ICD-10-CM | POA: Diagnosis not present

## 2016-01-04 MED ORDER — FLUTICASONE PROPIONATE 50 MCG/ACT NA SUSP: NASAL | Status: DC

## 2016-01-04 MED ORDER — AMOXICILLIN 500 MG PO CAPS
1000.0000 mg | ORAL_CAPSULE | Freq: Two times a day (BID) | ORAL | Status: AC
Start: 2016-01-04 — End: 2016-01-14

## 2016-01-04 NOTE — Progress Notes (Signed)
   Subjective:    Patient ID: Deborah Gonzales, female    DOB: 27-May-1981, 36 y.o.   MRN: [id number] By signing my name below, I, Littie Deeds, attest that this documentation has been prepared under the direction and in the presence of Ellamae Sia, MD.  Electronically Signed: Littie Deeds, Medical Scribe. 01/04/2016. 10:17 AM.  HPI HPI Comments: Deborah Gonzales is a 35 y.o. female with a history of hypertension who presents to the [facility] complaining of gradual onset sore throat that started 3 days ago. Patient reports having associated rhinorrhea, cough, voice hoarseness, and sinus pressure. She has also had a migraine headache that started 3 days ago. She has not been eating much and notes there is some pain in her throat when drinking fluids. Patient denies fever. She does not have frequent problems with her sinuses or allergies. She missed work yesterday. NKDA.  Patient Active Problem List   Diagnosis Date Noted  . Tremor 12/20/2015  . Anemia 06/01/2015  . Numbness and tingling in hands 05/28/2015  . Health care maintenance 12/23/2013  . FSGS (focal segmental glomerulosclerosis) 07/02/2012  . HYPERLIPIDEMIA 10/25/2006  . Severe obesity (BMI >= 40) (HCC) 10/25/2006  . Essential hypertension 10/25/2006   Current outpatient prescriptions:  .  enalapril (VASOTEC) 20 MG tablet, TAKE TWO TABLETS BY MOUTH DAILY, Disp: 180 tablet, Rfl: 1 .  ferrous sulfate 325 (65 FE) MG tablet, Take 1 tablet (325 mg total) by mouth daily., Disp: 30 tablet, Rfl: 3 .  hydrochlorothiazide (HYDRODIURIL) 25 MG tablet, TAKE ONE TABLET BY MOUTH ONCE DAILY, Disp: 90 tablet, Rfl: 1   Review of Systems noncontrib    Objective:   Physical Exam  Constitutional: She is oriented to person, place, and time. She appears well-developed and well-nourished. No distress.  HENT:  Head: Normocephalic and atraumatic.  Mouth/Throat: Oropharynx is clear and moist. No oropharyngeal exudate.    Purulent discharge bilaterally. Throat has lots of postnasal drainage.  Eyes: Pupils are equal, round, and reactive to light.  Neck: Neck supple.  Cardiovascular: Normal rate.   Pulmonary/Chest: Effort normal and breath sounds normal. No respiratory distress. She has no wheezes.  Clear to auscultation bilaterally.   Musculoskeletal: She exhibits no edema.  Neurological: She is alert and oriented to person, place, and time. No cranial nerve deficit.  Skin: Skin is warm and dry. No rash noted.  Psychiatric: She has a normal mood and affect. Her behavior is normal.  Nursing note and vitals reviewed. BP 144/96 mmHg  Pulse 95  Temp(Src) 98.9 F (37.2 C) (Oral)  Resp 18  Ht  (1.676 m)  Wt 367 lb 6.4 oz (166.652 kg)  BMI 59.33 kg/m2  SpO2 96%  LMP 12/30/2015       Assessment & Plan:  Acute maxillary sinusitis, recurrence not specified  Meds ordered this encounter  Medications  . amoxicillin (AMOXIL) 500 MG capsule    Sig: Take 2 capsules (1,000 mg total) by mouth 2 (two) times daily.    Dispense:  40 capsule    Refill:  0  . fluticasone (FLONASE) 50 MCG/ACT nasal spray    Sig: One spray each nostril twice a day    Dispense:  16 g    Refill:  0     I have completed the patient encounter in its entirety as documented by the scribe, with editing by me where necessary. Leone Putman P. Merla Riches, M.D.

## 2016-04-11 ENCOUNTER — Other Ambulatory Visit: Payer: Self-pay | Admitting: Internal Medicine

## 2016-04-24 ENCOUNTER — Encounter: Payer: Self-pay | Admitting: *Deleted

## 2016-06-14 ENCOUNTER — Other Ambulatory Visit: Payer: Self-pay | Admitting: Internal Medicine

## 2016-06-14 DIAGNOSIS — I10 Essential (primary) hypertension: Secondary | ICD-10-CM

## 2016-06-25 NOTE — Assessment & Plan Note (Addendum)
BP Readings from Last 3 Encounters:  06/26/16 135/90  01/04/16 (!) 144/96  12/21/15 131/84   Lab Results  Component Value Date   CREATININE 0.48 (L) 11/26/2015   Lab Results  Component Value Date   K 3.7 11/26/2015   Assessment On HCTZ 25 and enalapril 40.  Home BP last week 120s/70s per patient. BP control: well controlled  Plan Medications: continue current meds Other: encouraged exercise and weight loss

## 2016-06-25 NOTE — Assessment & Plan Note (Addendum)
Has not had pap smear since 1990s, and has not been sexually active since that time.  Offered pap and explained importance as a cancer screening test.  Patient declined.

## 2016-06-25 NOTE — Assessment & Plan Note (Deleted)
Stable, intermittent parasthesias of bilateral hands.  Followed by neurologist Dr Stephanie Acreharles Willis.  Normal EMG/NCS (06/2015), cervical MRI w/ mild C3-4 disc herniation and mild foraminal narrowing (12/2015) which does not explain her symptoms.  Normal B12 (05/2015), TSH (11/2015), and serum heavy metals (05/2015).  HIV negative (05/2015).

## 2016-06-25 NOTE — Progress Notes (Signed)
CC: L foot pain  HPI:  [name] is a 35 y.o. woman with history of FSGS, HTN, HL, and obesity who presents for a routine 6 month follow-up for her chronic medical conditions.  Additionally, she has had pain in the L foot for the last 2 months.  This pain started insidiously and is worse with weight bearing and walking.  Pain is focused over anterior ankle, does not radiate, and at its worst makes her feel like she cannot walk.  She has seen podiatrist Dr Theodosia Paling with [facility].  Per patient, radiographs on L foot were negative and she is not aware of a diagnosis.  3x intraarticular steroid injections gave minimal relief, and she is currently wearing a boot.  She has another appointment this Wed with podiatrist.  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   Past Medical History:  Diagnosis Date  . 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 On appropriate therapy with ACE-i  . Hyperlipidemia   . Hypertension   . Severe obesity (BMI >= 40) (HCC)    BMI 55   Review of Systems:  Review of Systems  Constitutional: Positive for malaise/fatigue. Negative for chills and fever.  HENT: Negative for congestion and sore throat.   Eyes: Negative for double vision.  Respiratory: Negative for cough, shortness of breath and wheezing.   Cardiovascular: Positive for leg swelling. Negative for chest pain, palpitations, orthopnea and PND.  Gastrointestinal: Negative for abdominal pain, constipation, diarrhea, nausea and vomiting.  Genitourinary: Negative for dysuria, frequency, hematuria and urgency.  Musculoskeletal: Positive for joint pain. Negative for neck pain.  Skin: Negative for itching and rash.  Neurological: Positive for tingling and tremors. Negative for headaches.  Endo/Heme/Allergies: Does not bruise/bleed easily.  Psychiatric/Behavioral: Negative for depression and substance abuse.  The patient has insomnia.     Physical Exam:  Vitals:   06/26/16 1515  BP: 135/90  Pulse: (!) 108  Temp: 98.4 F (36.9 C)  TempSrc: Oral  SpO2: 100%  Weight: (!) 369 lb 4.8 oz (167.5 kg)  Height:  (1.676 m)   Physical Exam  Constitutional: She is oriented to person, place, and time.  Obese woman with orthopedic boot on L foot, in no distress  HENT:  Head: Normocephalic and atraumatic.  Mouth/Throat: Oropharynx is clear and moist. No oropharyngeal exudate.  Eyes: Conjunctivae are normal. Pupils are equal, round, and reactive to light. No scleral icterus.  Neck: Normal range of motion. Neck supple.  Cardiovascular: Normal rate, regular rhythm, normal heart sounds and intact distal pulses.   Pulmonary/Chest: Effort normal and breath sounds normal. No respiratory distress. She has no wheezes. She has no rales.  Abdominal:  Soft, obese  Musculoskeletal:  Mild L>R LE swelling.  ROM in bilateral ankles symmetrical, no deformity or tenderness.  No pain with active or passive plantar flexion, dorsiflexion, supination, or pronation.  Lymphadenopathy:    She has no cervical adenopathy.  Neurological: She is alert and oriented to person, place, and time. No cranial nerve deficit. She exhibits normal muscle tone.  Skin: Skin is warm and dry. No rash noted. No erythema.  Hair stubble on chin  Psychiatric: She has a normal mood and affect. Judgment and thought content normal.   BMP Latest Ref Rng & Units 11/26/2015 05/28/2015 10/19/2014  Glucose 65 - 99 mg/dL 80 89 86  BUN 7 - 25 mg/dL 9 9  11  Creatinine 0.50 - 1.10 mg/dL 6.57(Q0.48(L) 4.690.51 6.290.53  Sodium 135 - 146 mmol/L 140 139 138  Potassium 3.5 - 5.3 mmol/L 3.7 3.8 3.8  Chloride 98 - 110 mmol/L 102 103 102  CO2 20 - 31 mmol/L 28 26 24   Calcium 8.6 - 10.2 mg/dL 9.8 9.1 9.4   Lab Results  Component Value Date   HGBA1C 5.8 11/26/2015   HGBA1C 5.7 06/01/2015   HGBA1C 5.6 10/19/2014   CBC Latest Ref Rng & Units 11/26/2015 05/28/2015  12/08/2013  WBC 4.6 - 10.2 K/uL 5.5 7.7 5.8  Hemoglobin 12.2 - 16.2 g/dL 11.8(A) 10.1(L) 12.8  Hematocrit 37.7 - 47.9 % 36.7(A) 32.2(L) 40.7  Platelets 150 - 400 K/uL - 459(H) -      Assessment & Plan:   See Encounters Tab for problem based charting.  Patient seen with Dr. Criselda PeachesMullen

## 2016-06-26 ENCOUNTER — Ambulatory Visit (INDEPENDENT_AMBULATORY_CARE_PROVIDER_SITE_OTHER): Payer: BLUE CROSS/BLUE SHIELD | Admitting: Internal Medicine

## 2016-06-26 ENCOUNTER — Encounter (INDEPENDENT_AMBULATORY_CARE_PROVIDER_SITE_OTHER): Payer: Self-pay

## 2016-06-26 ENCOUNTER — Encounter: Payer: Self-pay | Admitting: Internal Medicine

## 2016-06-26 VITALS — BP 135/90 | HR 108 | Temp 98.4°F | Ht 66.0 in | Wt 369.3 lb

## 2016-06-26 DIAGNOSIS — R5383 Other fatigue: Secondary | ICD-10-CM | POA: Diagnosis not present

## 2016-06-26 DIAGNOSIS — E785 Hyperlipidemia, unspecified: Secondary | ICD-10-CM

## 2016-06-26 DIAGNOSIS — M25572 Pain in left ankle and joints of left foot: Secondary | ICD-10-CM | POA: Diagnosis not present

## 2016-06-26 DIAGNOSIS — Z862 Personal history of diseases of the blood and blood-forming organs and certain disorders involving the immune mechanism: Secondary | ICD-10-CM

## 2016-06-26 DIAGNOSIS — D509 Iron deficiency anemia, unspecified: Secondary | ICD-10-CM | POA: Diagnosis not present

## 2016-06-26 DIAGNOSIS — I10 Essential (primary) hypertension: Secondary | ICD-10-CM | POA: Diagnosis not present

## 2016-06-26 DIAGNOSIS — Z0000 Encounter for general adult medical examination without abnormal findings: Secondary | ICD-10-CM

## 2016-06-26 DIAGNOSIS — Z79899 Other long term (current) drug therapy: Secondary | ICD-10-CM

## 2016-06-26 DIAGNOSIS — Z6841 Body Mass Index (BMI) 40.0 and over, adult: Secondary | ICD-10-CM

## 2016-06-26 HISTORY — DX: Pain in left ankle and joints of left foot: M25.572

## 2016-06-26 NOTE — Assessment & Plan Note (Signed)
CBC Latest Ref Rng & Units 11/26/2015 05/28/2015 12/08/2013  WBC 4.6 - 10.2 K/uL 5.5 7.7 5.8  Hemoglobin 12.2 - 16.2 g/dL 11.8(A) 10.1(L) 12.8  Hematocrit 37.7 - 47.9 % 36.7(A) 32.2(L) 40.7  Platelets 150 - 400 K/uL - 459(H) -   Increasing fatigue in past year.  Menstrual cycles regular, monthly, flow for 4 days, not overly heavy.  Fatigue likely explained by poor sleep, but with history of anemia in menstruating woman will check for anemia. -CBC -Iron studies

## 2016-06-26 NOTE — Assessment & Plan Note (Addendum)
Wt Readings from Last 3 Encounters:  01/04/16 (!) 367 lb 6.4 oz (166.7 kg)  12/28/15 (!) 362 lb (164.2 kg)  12/21/15 (!) 365 lb 3.2 oz (165.7 kg)   Weight stable, patient not currently exercising.  Used to walk for exercise, but fatigue and work schedule are barriers.  Counseled on importance of weight loss, exercise, and healthy diet.  Suggested alternative forms of exercise, including cycling and swimming.

## 2016-06-26 NOTE — Patient Instructions (Addendum)
Good job on your blood pressure!  Keep taking your medications as prescribed.  We will check blood tests today to see if you have low iron, which could contribute to you feeling tired.  Losing weight is important, and will help your blood pressure and joint pain.  Try and be active, walking for at least 30 minutes per day, and avoid foods high in fat and sugar.

## 2016-06-26 NOTE — Assessment & Plan Note (Addendum)
2 months of left ankle pain with no known injury.  Joint is stable, no deformity, foot neurovascularly intact.  Pain is likely MSK, with her obesity a risk factory for OA. -Request records from podiatrist -Tylenol PRN for pain

## 2016-06-27 LAB — LIPID PANEL
CHOL/HDL RATIO: 3.8 ratio (ref 0.0–4.4)
Cholesterol, Total: 205 mg/dL — ABNORMAL HIGH (ref 100–199)
HDL: 54 mg/dL (ref >39)
LDL Calculated: 132 mg/dL — ABNORMAL HIGH (ref 0–99)
Triglycerides: 93 mg/dL (ref 0–149)
VLDL CHOLESTEROL CAL: 19 mg/dL (ref 5–40)

## 2016-06-27 LAB — CBC
HEMATOCRIT: 38.1 % (ref 34.0–46.6)
Hemoglobin: 12.6 g/dL (ref 11.1–15.9)
MCH: 27.6 pg (ref 26.6–33.0)
MCHC: 33.1 g/dL (ref 31.5–35.7)
MCV: 84 fL (ref 79–97)
Platelets: 437 10*3/uL — ABNORMAL HIGH (ref 150–379)
RBC: 4.56 x10E6/uL (ref 3.77–5.28)
RDW: 15.1 % (ref 12.3–15.4)
WBC: 7.9 10*3/uL (ref 3.4–10.8)

## 2016-06-27 LAB — IRON AND TIBC
Iron Saturation: 14 % — ABNORMAL LOW (ref 15–55)
Iron: 41 ug/dL (ref 27–159)
TIBC: 303 ug/dL (ref 250–450)
UIBC: 262 ug/dL (ref 131–425)

## 2016-06-27 LAB — FERRITIN: FERRITIN: 60 ng/mL (ref 15–150)

## 2016-07-01 ENCOUNTER — Encounter: Payer: Self-pay | Admitting: Internal Medicine

## 2016-07-01 NOTE — Progress Notes (Signed)
Your blood counts and iron levels look good - you are not anemic.  Your cholesterol is a little high, which is a risk factor for heart attacks in the long run.  Exercise and losing weight will help.  Let's talk about ways to try and lower it at your next appointment.

## 2016-07-06 NOTE — Progress Notes (Signed)
Internal Medicine Clinic Attending  I saw and evaluated the patient.  I personally confirmed the key portions of the history and exam documented by Dr. O'Sullivan and I reviewed pertinent patient test results.  The assessment, diagnosis, and plan were formulated together and I agree with the documentation in the resident's note.   

## 2016-08-14 ENCOUNTER — Telehealth: Payer: Self-pay | Admitting: Internal Medicine

## 2016-08-14 NOTE — Telephone Encounter (Signed)
Pt is requesting a referral to a Hand Specialist for her pain.  Please advise.

## 2016-08-17 NOTE — Telephone Encounter (Signed)
She did not mention hand pain at her last appointment.  Please schedule her for [facility] or PCP appointment at her convenience for evalutation.

## 2016-08-21 IMAGING — DX DG THORACIC SPINE 3V
3 series · 3 of 3 positions shown · non-contrast
Comparison: Thoracic spine series of August 22, 2006

CLINICAL DATA: Upper back injury in 0440 with persistent upper back
pain with numbness and tingling in the upper extremities ; symptoms
wax and Dinero

EXAM:
THORACIC SPINE - 2 VIEW + SWIMMERS

[t-spine ap]
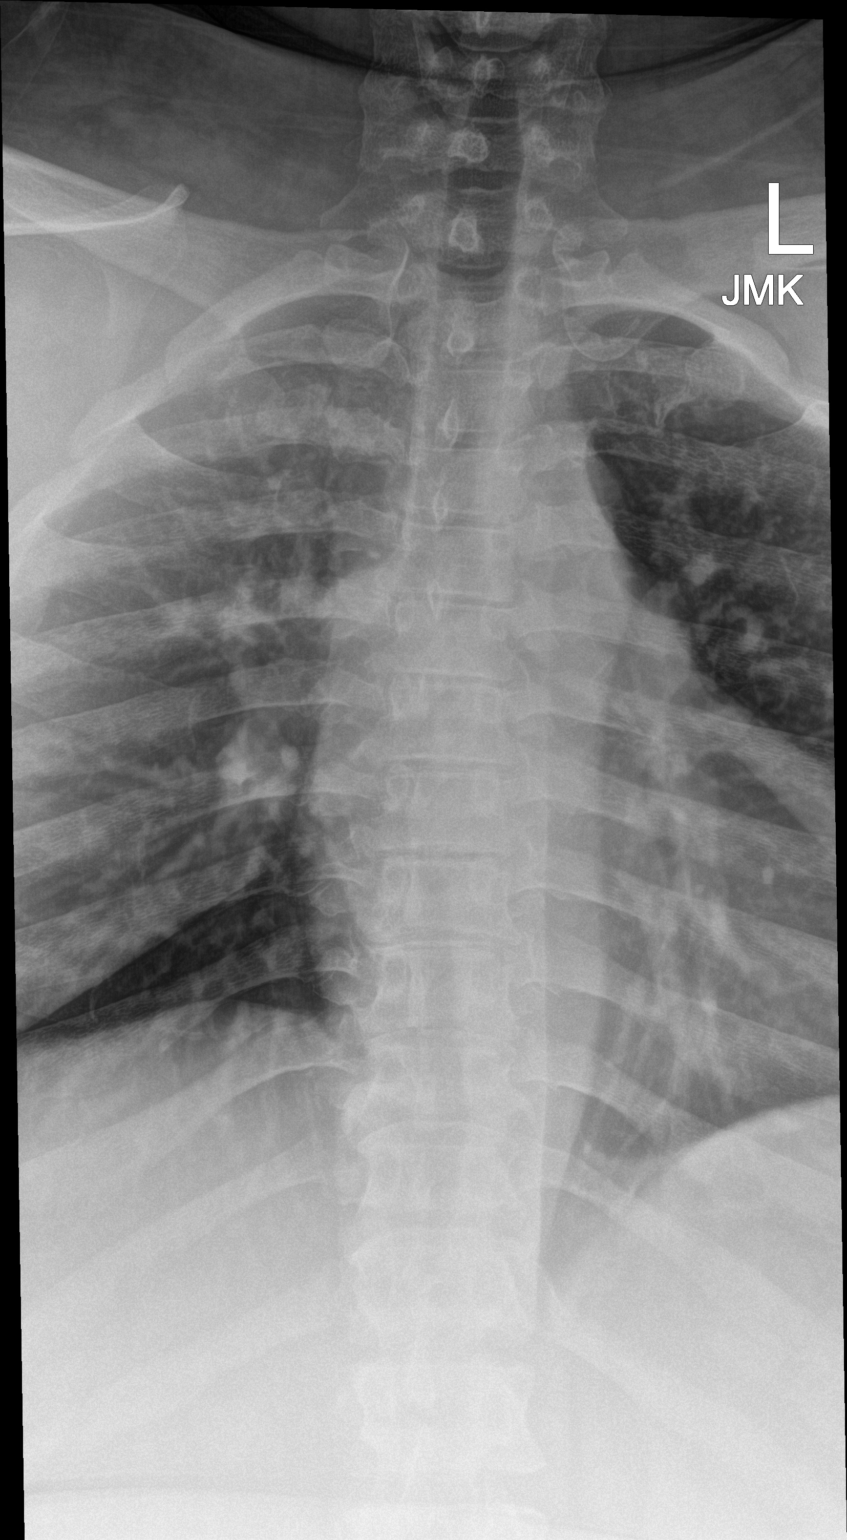

[t-spine lat]
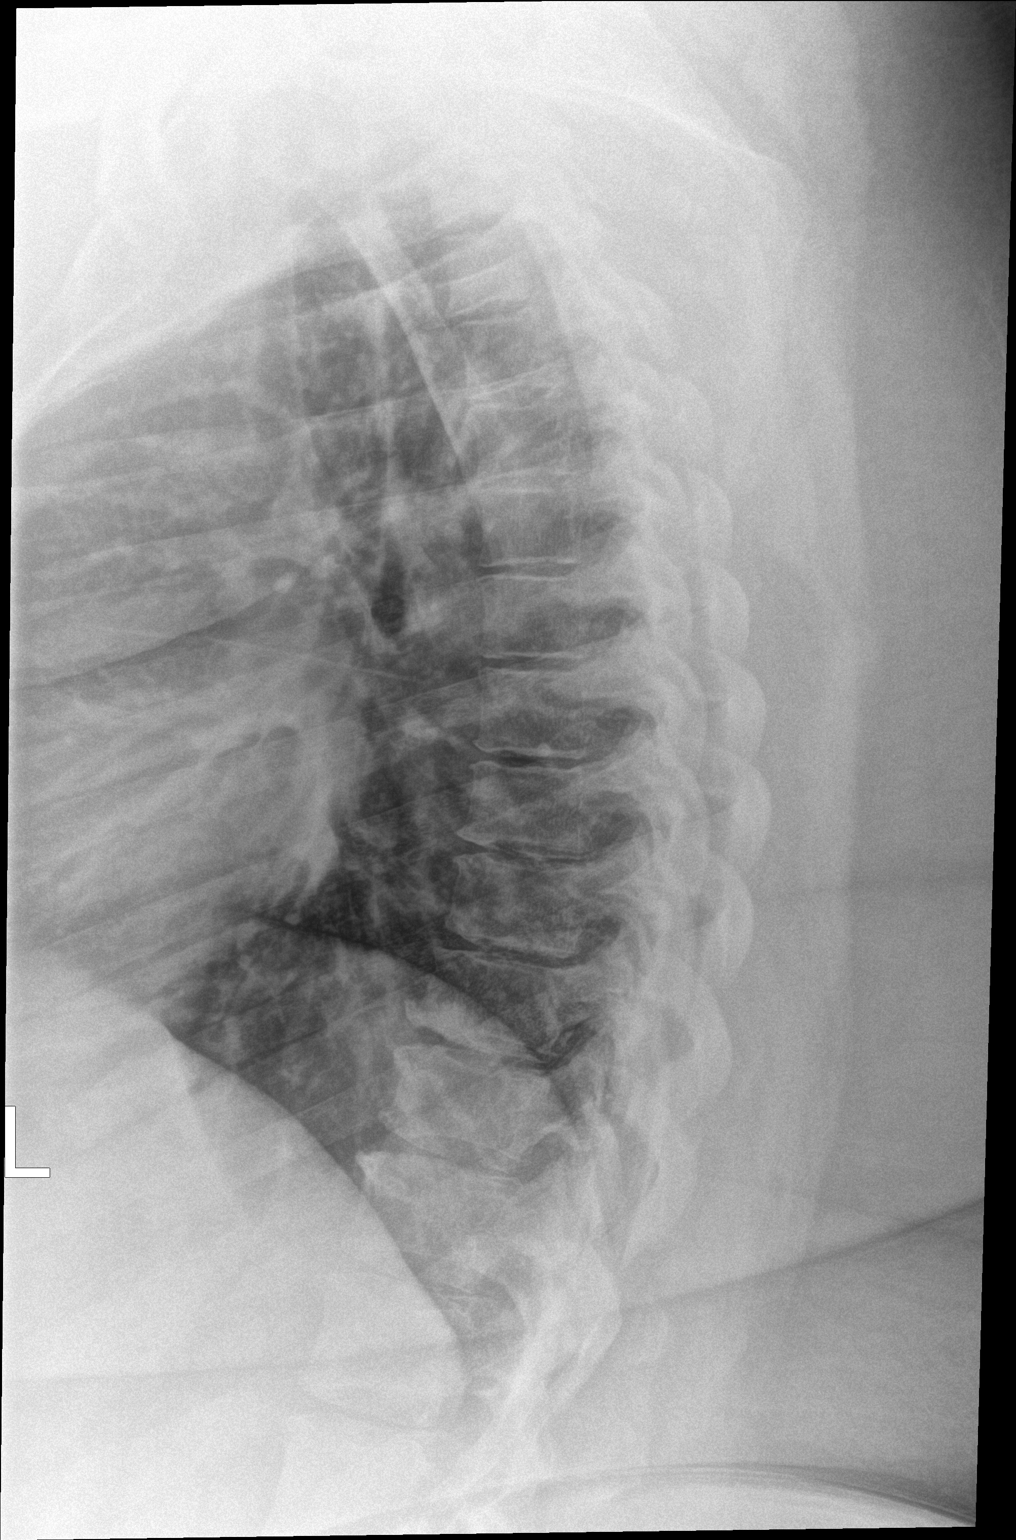

[t-spine swimmers]
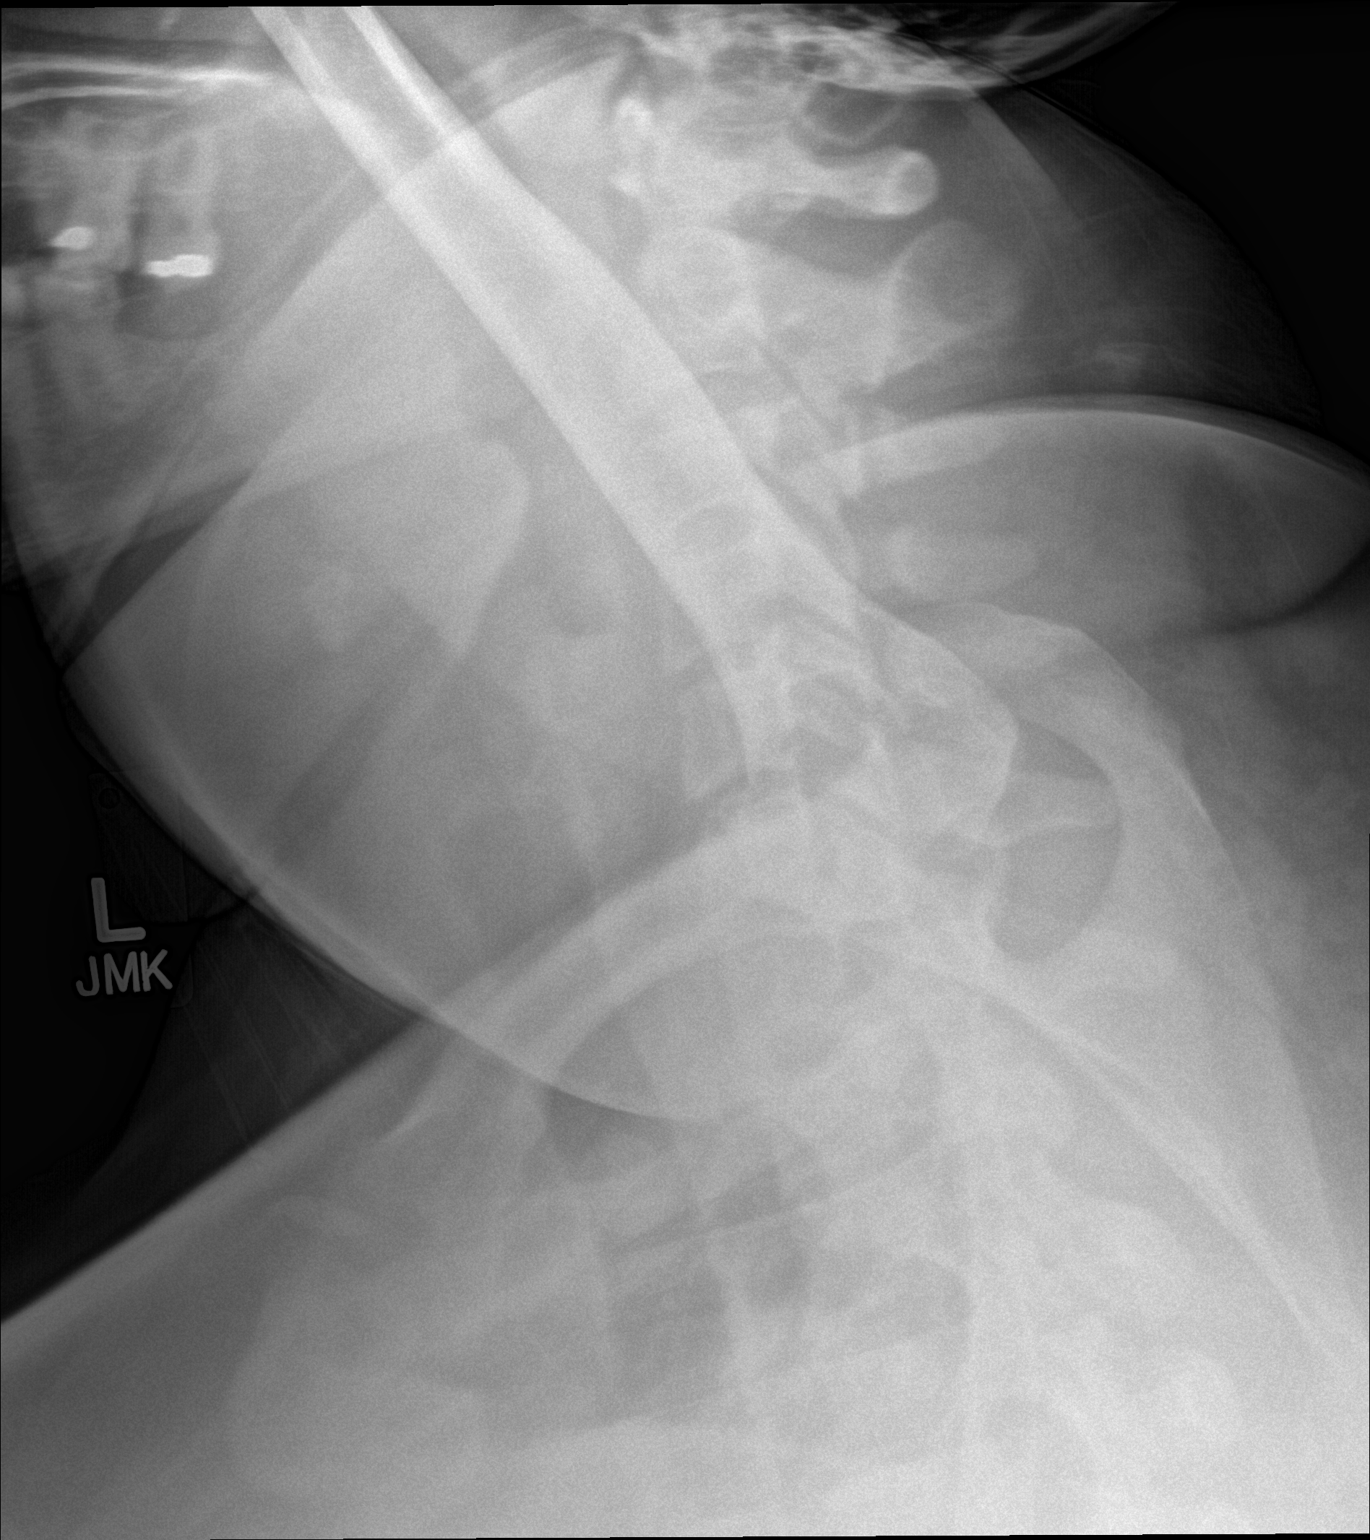

[3 of 3 positions shown; findings below may reference images not displayed]

FINDINGS: The thoracic vertebral bodies are preserved in height. The disc
space heights are well maintained. There is small anterior endplate
osteophytes in the lower lumbar spine. The cervicothoracic junction
is normal where visualized. The pedicles are intact. There are no
abnormal paravertebral soft tissue densities.
IMPRESSION: There are mild degenerative changes centered in the lower thoracic
spine. There is no compression fracture nor other acute bony
abnormality.

## 2016-08-30 NOTE — Telephone Encounter (Signed)
[facility] for her referral.

## 2016-09-04 ENCOUNTER — Telehealth: Payer: Self-pay | Admitting: Internal Medicine

## 2016-09-04 NOTE — Telephone Encounter (Signed)
APT. REMINDER CALL, LMTCB °

## 2016-09-05 ENCOUNTER — Ambulatory Visit (INDEPENDENT_AMBULATORY_CARE_PROVIDER_SITE_OTHER): Payer: BLUE CROSS/BLUE SHIELD | Admitting: Internal Medicine

## 2016-09-05 VITALS — BP 139/75 | HR 105 | Temp 98.4°F | Ht 66.0 in | Wt 369.6 lb

## 2016-09-05 DIAGNOSIS — R200 Anesthesia of skin: Secondary | ICD-10-CM | POA: Diagnosis not present

## 2016-09-05 DIAGNOSIS — M79621 Pain in right upper arm: Secondary | ICD-10-CM

## 2016-09-05 DIAGNOSIS — M79622 Pain in left upper arm: Secondary | ICD-10-CM

## 2016-09-05 DIAGNOSIS — R202 Paresthesia of skin: Secondary | ICD-10-CM

## 2016-09-05 HISTORY — DX: Pain in left upper arm: M79.622

## 2016-09-05 HISTORY — DX: Pain in right upper arm: M79.621

## 2016-09-05 NOTE — Progress Notes (Signed)
CC: pain in both arms with lifting  HPI:  [name] is a 35 y.o. woman with history of HTN, HL, FSGS, and obesity who presents with complaints of bilateral arm pain with lifting and numbness in her hands.  For past 1.5 years, has burning pain and fatigue in bilateral upper arms and forearms that is worse during activity (lifting, pushing), and rapidly returns to normal with rest.  She also has some difficulty with grip due to hand numbness.  Denies any shooting pains or tingling sensation.  No hip or thigh pain, weakness.  No eye pain, No jaw fatigue or tiredness, no headache.  No fevers or chills.  [facility], most recently on 12/20/2015.  Offered trial of Gabapentin which patient declined.  MRI neck on 12/28/2015 with moderate C3-4 foraminal stenosis.  NCS on 06/24/2015 of bilateral UE was normal.  She was last seen in clinic on 8/14.  At that time, L foot pain was her main complaint.  She has been seeing a podiatrist, and reported negative radiographs of the foot.  She also notes chronic head and hand tremors.  It does not affect her writing.  Has not noticed vocal tremor.  No difficulty initiating movement. Does not drink alcohol.  Exercises 1-2x per week, exercise bike and arm machines.    Past Medical History:  Diagnosis Date  . 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 On appropriate therapy with ACE-i  . Hyperlipidemia   . Hypertension   . Severe obesity (BMI >= 40) (HCC)    BMI 55    Review of Systems:  Review of Systems  Constitutional: Negative for chills and fever.  HENT:       Negative for jaw pain or fatigue  Musculoskeletal: Positive for myalgias. Negative for neck pain.  Neurological: Positive for tremors and sensory change. Negative for dizziness, tingling and headaches.    Physical Exam:  Vitals:   09/05/16 0921  BP: 139/75  Pulse: (!) 105  Temp: 98.4 F  (36.9 C)  TempSrc: Oral  SpO2: 100%  Weight: (!) 369 lb 9.6 oz (167.6 kg)  Height: 5\' 6"  (1.676 m)   Physical Exam  Constitutional:  Obese, in no distress  Musculoskeletal: She exhibits no edema, tenderness or deformity.  Increased tone of bilateral trapezii and thoracic paraspinal muscles  Neurological:  Strength 5/5 symmetric shoulder shrug, shoulder abduction, elbow flex/extend, wrist flex/extend, finger abduction, grip Sensation to light touch grossly intact throughout UEs and LEs Sensation to pinprick reduced in bilateral hands and feet, and more in 2nd fingers than 4th and 5th. Negative Phalen's and Tinel's Bilateral postural tremor of hands, no resting or action tremor Resting tremor of head Finger to nose and heel to shin intact Rapid alternating movements of hands intact  Skin: Skin is warm and dry.  Psychiatric: She has a normal mood and affect. Her behavior is normal.    Lab Results  Component Value Date   HGBA1C 5.8 11/26/2015   . Component     Latest Ref Rng & Units 11/26/2015  TSH     0.350 - 4.500 uIU/mL 0.743     Component     Latest Ref Rng & Units 06/01/2015  Arsenic, Blood     <23 mcg/L <3  Mercury, B     <=10 mcg/L <4  Lead     <10 mcg/dL <2  Collection site  Venous   Component     Latest Ref Rng & Units 05/28/2015  HIV     NONREACTIVE NONREACTIVE   Component     Latest Ref Rng & Units 05/28/2015  Vitamin B12     211 - 911 pg/mL 390    Assessment & Plan:   See Encounters Tab for problem based charting.  Patient seen with Dr. Cleda Daub

## 2016-09-05 NOTE — Patient Instructions (Addendum)
You were seen today for pain in your arms and numbness in your hands.  The good news is I don't think there's anything dangerous going on to explain your symptoms.  The MRI of your neck didn't show any pinched nerve, and the Nerve Conduction Study by the neurologist did not show any nerve damage.  I'm not sure exactly why you're feeling the way you do, though.  I have given you a referral to Physical Therapy.  Work with them to strengthen your shoulders and arms.  Getting stronger and losing weight will probably help.  Try and get 7-8 hours of good sleep every night and exercise regularly.  We'll see you back in around 3 months.  If things get worse with your arms, please call for an earlier appointment.

## 2016-09-05 NOTE — Assessment & Plan Note (Addendum)
Exertional pain in bilateral arms which she describes as burning, resolving almost instantaenously with rest.  No muscular tenderness or soreness, no symptoms of proximal leg muscles, and no constitutional symptoms.  She has had recent cervical imaging and there are no anatomical changes in her neck to explain her symptoms. I do not think this is an inflammatory or rheumatologic condition.  I think her arm pain is most likely deconditioning and true fatigue, or may have a component of chronic pain. -PT referral -RTC PRN -Could consider inflammatory markers if symptoms progress

## 2016-09-05 NOTE — Assessment & Plan Note (Signed)
Her exam is consistent with peripheral neuropathy and carpal tunnel syndrome, but she had had a thorough workup for peripheral neuropathy, cervical radiculopathy, and carpal tunnel syndrome, with not revealing findings.  This may represent a somatization syndrome, chronic pain, or potentially early peripheral neuropathy of unknown etiology. -PT -Exercise, weight loss, sleep hygeine -RTC PRN

## 2016-09-08 NOTE — Progress Notes (Signed)
Internal Medicine Clinic Attending  I saw and evaluated the patient.  I personally confirmed the key portions of the history and exam documented by Dr. O'Sullivan and I reviewed pertinent patient test results.  The assessment, diagnosis, and plan were formulated together and I agree with the documentation in the resident's note.   

## 2016-09-11 ENCOUNTER — Encounter: Payer: Self-pay | Admitting: Internal Medicine

## 2016-09-19 ENCOUNTER — Ambulatory Visit: Payer: BLUE CROSS/BLUE SHIELD | Attending: Internal Medicine | Admitting: Physical Therapy

## 2016-09-19 DIAGNOSIS — M25622 Stiffness of left elbow, not elsewhere classified: Secondary | ICD-10-CM | POA: Insufficient documentation

## 2016-09-19 DIAGNOSIS — M25522 Pain in left elbow: Secondary | ICD-10-CM | POA: Insufficient documentation

## 2016-09-19 DIAGNOSIS — M6281 Muscle weakness (generalized): Secondary | ICD-10-CM | POA: Insufficient documentation

## 2016-09-19 DIAGNOSIS — R293 Abnormal posture: Secondary | ICD-10-CM | POA: Insufficient documentation

## 2016-09-19 DIAGNOSIS — M25521 Pain in right elbow: Secondary | ICD-10-CM | POA: Insufficient documentation

## 2016-09-19 DIAGNOSIS — M25621 Stiffness of right elbow, not elsewhere classified: Secondary | ICD-10-CM | POA: Insufficient documentation

## 2016-09-19 NOTE — Addendum Note (Signed)
Addended by: Neomia DearPOWERS, Janiah Devinney E on: 09/19/2016 04:34 PM   Modules accepted: Orders

## 2016-09-28 ENCOUNTER — Encounter: Payer: Self-pay | Admitting: Physical Therapy

## 2016-09-28 ENCOUNTER — Ambulatory Visit: Payer: BLUE CROSS/BLUE SHIELD | Admitting: Physical Therapy

## 2016-09-28 DIAGNOSIS — M25521 Pain in right elbow: Secondary | ICD-10-CM | POA: Diagnosis present

## 2016-09-28 DIAGNOSIS — M6281 Muscle weakness (generalized): Secondary | ICD-10-CM | POA: Diagnosis present

## 2016-09-28 DIAGNOSIS — M25621 Stiffness of right elbow, not elsewhere classified: Secondary | ICD-10-CM | POA: Diagnosis present

## 2016-09-28 DIAGNOSIS — M25522 Pain in left elbow: Secondary | ICD-10-CM

## 2016-09-28 DIAGNOSIS — M25622 Stiffness of left elbow, not elsewhere classified: Secondary | ICD-10-CM | POA: Diagnosis present

## 2016-09-28 DIAGNOSIS — R293 Abnormal posture: Secondary | ICD-10-CM

## 2016-09-29 NOTE — Therapy (Signed)
[facility] [address] [place], [place], [address] Phone: [phone]   Fax:  [phone]  Physical Therapy Evaluation  Patient Details  Name: Deborah Gonzales MRN: [id number] Date of Birth: 05/01/1981 Referring Provider: Dr Woodroe ModeMichael Osullivan   Encounter Date: 09/28/2016      PT End of Session - 09/29/16 1012    Visit Number 1   Number of Visits 16   Date for PT Re-Evaluation 11/24/16   Authorization Type [employer] 20% co-pay    PT Start Time 1015   PT Stop Time 1101   PT Time Calculation (min) 46 min   Activity Tolerance Patient tolerated treatment well   Behavior [facility] Therapy [facility] for tasks assessed/performed      Past Medical History:  Diagnosis Date  . 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 On appropriate therapy with ACE-i  . Hyperlipidemia   . Hypertension   . Severe obesity (BMI >= 40) (HCC)    BMI 55    Past Surgical History:  Procedure Laterality Date  . RENAL BIOPSY  1999    There were no vitals filed for this visit.       Subjective Assessment - 09/28/16 1025    Subjective Patient reports bilateral numbness starting in her elbows and going into her hands. She has shooting pain into her hands at times. The numbness is always there. 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    Patient is accompained by: Family member   Limitations Lifting;Writing;House hold activities   How long can you sit comfortably? N/A   How long can you stand comfortably? N/A   How long can you walk comfortably? N/A    Diagnostic tests MRI of cervical spine: Mild c3-c4 disc bulging    Patient Stated Goals To have less numbness in her hands    Currently in Pain? Yes   Pain Score 5    Pain Location Arm   Pain Orientation Right;Left   Pain Descriptors / Indicators Aching;Numbness   Pain Onset More than a month ago   Pain Frequency Intermittent   Aggravating  Factors  use of bilateral hands    Pain Relieving Factors rest   Effect of Pain on Daily Activities difficulty perfroming work tasks             [facility] - 09/29/16 0001      Assessment   Medical Diagnosis Bilateral epicondylitis    Referring Provider Dr Woodroe ModeMichael Osullivan    Onset Date/Surgical Date --  over a year    Hand Dominance Right   Next MD Visit As needed    Prior Therapy No     Precautions   Precautions None     Restrictions   Weight Bearing Restrictions No     Home Environment   Additional Comments Nothing pertninent      Prior Function   Level of Independence Independent     Cognition   Overall Cognitive Status Within Functional Limits for tasks assessed   Attention Focused   Focused Attention Appears intact   Memory Appears intact   Awareness Appears intact   Problem Solving Appears intact     Observation/Other Assessments   Observations Morbid obesity; Patient sits with hands open    Focus on Therapeutic Outcomes (FOTO)  Elbow      Sensation   Additional Comments Nu,mbness from elbows into bilateral hands following a radial  nerve distrabution      Coordination   Gross Motor Movements are Fluid and Coordinated No     Posture/Postural Control   Posture Comments rounded shoulders      ROM / Strength   AROM / PROM / Strength AROM;PROM;Strength     AROM   Overall AROM Comments limited active supination bilateral; Painfula active wrist extension; Cervical rotation 55 degrees to the right 58 degrees to the left.      PROM   Overall PROM Comments supination left 20 degrees with pain right 18 degrees with pain; Can only supinate further with shoulder comensation   PROM Assessment Site Wrist;Elbow   Right/Left Elbow Right;Left   Right/Left Wrist Right;Left   Right Wrist Extension 30 Degrees   Right Wrist Flexion --  WNL   Left Wrist Extension 34 Degrees   Left Wrist Flexion --  WNL     Strength   Overall Strength Comments Grip 17  lb right 20 lb left; bilateral fexion/ abudction 4/5    Strength Assessment Site Elbow;Wrist   Right/Left Elbow Right;Left   Right/Left Wrist Right;Left   Right Wrist Flexion 5/5   Right Wrist Extension 3/5   Left Wrist Flexion 5/5   Left Wrist Extension 3/5     Palpation   Palpation comment tendenress to plaption in bilateral lateral epicondyle are      Special Tests    Special Tests Cervical  Mills test (+) bilateral; radial tinells test (+) bilateral      Spurling's   Comment (-) bilateral      other    Comment Compression test Cervical negative                    [facility] Adult PT Treatment/Exercise - 09/29/16 0001      Elbow Exercises   Other elbow exercises wrsit flexion/ extension no weight x10 each, active pronation supination no weight x10; self supination stretch x10; towel grip x10l;      Modalities   Modalities Iontophoresis     Iontophoresis   Type of Iontophoresis Dexamethasone   Location bilateral lateral epicondyle    Dose 1cc each   Time slow release patch;      Manual Therapy   Manual therapy comments Manual supination stretching bilateral                 PT Education - 09/29/16 1012    Education provided Yes   Education Details HEP given, symptom mangement, Iontophoriesis    Person(s) Educated Patient   Methods Explanation;Demonstration;Verbal cues   Comprehension Verbalized understanding;Returned demonstration          PT Short Term Goals - 09/29/16 1022      PT SHORT TERM GOAL #1   Title Patient will increase elbow supination by 25 degrees    Time 4   Period Weeks   Status New     PT SHORT TERM GOAL #2   Title Patient will report decreased pain and numbness into her hands    Time 4   Period Weeks   Status New     PT SHORT TERM GOAL #3   Title Patient will improve grip strength by 10lbs bilateral    Time 4   Period Weeks   Status New     PT SHORT TERM GOAL #4   Title Patient will improve wrist extensor  strength to 4+/5 bilateral    Time 4   Period Weeks   Status New  PT SHORT TERM GOAL #5   Title Patient will be independent with program for stretching and strengthening    Time 4   Period Weeks   Status New           PT Long Term Goals - 09/29/16 1024      PT LONG TERM GOAL #1   Title Patient will pull patient at work up in bed without increased symptoms    Time 8   Period Weeks   Status New     PT LONG TERM GOAL #2   Title Patient will use her hands for daily tasks without her hands going numb    Time 8   Period Weeks   Status New     PT LONG TERM GOAL #3   Title Patient will increase bilateral grip strength to 45 pounds bilateral in order to hold objects.    Time 8   Period Weeks   Status New               Plan - 09/29/16 1014    Clinical Impression Statement Patient is a 35 year old female with bilateral numbness that begins in her elbows and follows a radial nerve distrabution. She had a positive radial tinnels test. She has a negative tennwels for carpel tunnel. She has a significant decrease in supination flexability. At work when she uses her arms she has increased symptoms. Her neck special tests were negative. She has a weak grip bilaterally. She has significant tenderness to palpation bilateral around her lateral epicondyle and on the left folling her ECRB. She would benefit from skilled therapy to adress the above deficits. After given stretching exercises patient continued to do them throughout the remainder of the session. Patient strongly advised not to overdo her exercises. Iontophorises applied to reduce the inflammation.     Rehab Potential Fair   Clinical Impairments Affecting Rehab Potential morbid obsity which effcts her mechanics when lifting; long standing pain and numbness    PT Frequency 2x / week   PT Duration 8 weeks   PT Treatment/Interventions ADLs/Self Care Home Management;Electrical Stimulation;Iontophoresis 4mg /ml  Dexamethasone;Moist Heat;Cryotherapy;Functional mobility training;Therapeutic activities;Therapeutic exercise;Neuromuscular re-education;Manual techniques;Patient/family education;Passive range of motion;Dry needling;Energy conservation;Taping;Visual/perceptual remediation/compensation   PT Next Visit Plan IASTYM to bilateral lateral epicondyle area. Manual stretching of wrist extensors, light strethening of extendors, Improve elbow supination. elbow flexion    PT Home Exercise Plan supination/ pronation; wrist extnesion/ flexion; self supination stretch, towel grip,    Consulted and Agree with Plan of Care Patient      Patient will benefit from skilled therapeutic intervention in order to improve the following deficits and impairments:  Obesity, Pain, Decreased strength, Decreased activity tolerance, Decreased endurance, Impaired UE functional use  Visit Diagnosis: Pain in left elbow - Plan: PT plan of care cert/re-cert  Pain in right elbow - Plan: PT plan of care cert/re-cert  Muscle weakness (generalized) - Plan: PT plan of care cert/re-cert  Abnormal posture - Plan: PT plan of care cert/re-cert  Stiffness of left elbow, not elsewhere classified - Plan: PT plan of care cert/re-cert  Stiffness of right elbow, not elsewhere classified - Plan: PT plan of care cert/re-cert     Problem List Patient Active Problem List   Diagnosis Date Noted  . Pain in both upper arms 09/05/2016  . Left ankle pain 06/26/2016  . Tremor 12/20/2015  . Anemia 06/01/2015  . Numbness and tingling in both hands 05/28/2015  . Health care maintenance 12/23/2013  .  FSGS (focal segmental glomerulosclerosis) 07/02/2012  . Hyperlipidemia 10/25/2006  . Severe obesity (BMI >= 40) (HCC) 10/25/2006  . Essential hypertension 10/25/2006    Dessie Coma PT DPT  09/29/2016, 10:53 AM  [facility] [address] [place], [place], [address] Phone: [phone]    Fax:  [phone]  Name: Deborah Gonzales MRN: [id number] Date of Birth: [birth year]

## 2016-09-29 NOTE — Therapy (Deleted)
[facility] [address] [place], [place], [address] Phone: [phone]   Fax:  [phone]  Physical Therapy Treatment  Patient Details  Name: Deborah Gonzales MRN: [id number] Date of Birth: 07/15/1981 Referring Provider: Dr Woodroe ModeMichael Osullivan   Encounter Date: 09/28/2016      PT End of Session - 09/29/16 1012    Visit Number 1   Number of Visits 16   Date for PT Re-Evaluation 11/24/16   Authorization Type [employer] 20% co-pay    PT Start Time 1015   PT Stop Time 1101   PT Time Calculation (min) 46 min   Activity Tolerance Patient tolerated treatment well   Behavior During Therapy [facility] for tasks assessed/performed      Past Medical History:  Diagnosis Date  . 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 On appropriate therapy with ACE-i  . Hyperlipidemia   . Hypertension   . Severe obesity (BMI >= 40) (HCC)    BMI 55    Past Surgical History:  Procedure Laterality Date  . RENAL BIOPSY  1999    There were no vitals filed for this visit.      Subjective Assessment - 09/28/16 1025    Subjective Patient reports bilateral numbness starting in her elbows and going into her hands. She has shooting pain into her hands at times. The numbness is always there. 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    Patient is accompained by: Family member   Limitations Lifting;Writing;House hold activities   How long can you sit comfortably? N/A   How long can you stand comfortably? N/A   How long can you walk comfortably? N/A    Diagnostic tests MRI of cervical spine: Mild c3-c4 disc bulging    Patient Stated Goals To have less numbness in her hands    Currently in Pain? Yes   Pain Score 5    Pain Location Arm   Pain Orientation Right;Left   Pain Descriptors / Indicators Aching;Numbness   Pain Onset More than a month ago   Pain Frequency Intermittent   Aggravating  Factors  use of bilateral hands    Pain Relieving Factors rest   Effect of Pain on Daily Activities difficulty perfroming work tasks             [facility] PT Assessment - 09/29/16 0001      Assessment   Medical Diagnosis Bilateral epicondylitis    Referring Provider Dr Woodroe ModeMichael Osullivan    Onset Date/Surgical Date --  over a year    Hand Dominance Right   Next MD Visit As needed    Prior Therapy No     Precautions   Precautions None     Restrictions   Weight Bearing Restrictions No     Home Environment   Additional Comments Nothing pertninent      Prior Function   Level of Independence Independent     Cognition   Overall Cognitive Status Within Functional Limits for tasks assessed   Attention Focused   Focused Attention Appears intact   Memory Appears intact   Awareness Appears intact   Problem Solving Appears intact     Observation/Other Assessments   Observations Morbid obesity; Patient sits with hands open    Focus on Therapeutic Outcomes (FOTO)  Elbow      Sensation   Additional Comments Nu,mbness from elbows into bilateral hands following a radial nerve  distrabution      Coordination   Gross Motor Movements are Fluid and Coordinated No     Posture/Postural Control   Posture Comments rounded shoulders      ROM / Strength   AROM / PROM / Strength AROM;PROM;Strength     AROM   Overall AROM Comments limited active supination bilateral; Painfula active wrist extension; Cervical rotation 55 degrees to the right 58 degrees to the left.      PROM   Overall PROM Comments supination left 20 degrees with pain right 18 degrees with pain; Can only supinate further with shoulder comensation   PROM Assessment Site Wrist;Elbow   Right/Left Elbow Right;Left   Right/Left Wrist Right;Left   Right Wrist Extension 30 Degrees   Right Wrist Flexion --  WNL   Left Wrist Extension 34 Degrees   Left Wrist Flexion --  WNL     Strength   Overall Strength Comments Grip 17  lb right 20 lb left; bilateral fexion/ abudction 4/5    Strength Assessment Site Elbow;Wrist   Right/Left Elbow Right;Left   Right/Left Wrist Right;Left   Right Wrist Flexion 5/5   Right Wrist Extension 3/5   Left Wrist Flexion 5/5   Left Wrist Extension 3/5     Palpation   Palpation comment tendenress to plaption in bilateral lateral epicondyle are      Special Tests    Special Tests Cervical  Mills test (+) bilateral; radial tinells test (+) bilateral      Spurling's   Comment (-) bilateral      other    Comment Compression test Cervical negative                      [facility] Adult PT Treatment/Exercise - 09/29/16 0001      Elbow Exercises   Other elbow exercises wrsit flexion/ extension no weight x10 each, active pronation supination no weight x10; self supination stretch x10; towel grip x10l;      Modalities   Modalities Iontophoresis     Iontophoresis   Type of Iontophoresis Dexamethasone   Location bilateral lateral epicondyle    Dose 1cc each   Time slow release patch;      Manual Therapy   Manual therapy comments Manual supination stretching bilateral                 PT Education - 09/29/16 1012    Education provided Yes   Education Details HEP given, symptom mangement, Iontophoriesis    Person(s) Educated Patient   Methods Explanation;Demonstration;Verbal cues   Comprehension Verbalized understanding;Returned demonstration          PT Short Term Goals - 09/29/16 1022      PT SHORT TERM GOAL #1   Title Patient will increase elbow supination by 25 degrees    Time 4   Period Weeks   Status New     PT SHORT TERM GOAL #2   Title Patient will report decreased pain and numbness into her hands    Time 4   Period Weeks   Status New     PT SHORT TERM GOAL #3   Title Patient will improve grip strength by 10lbs bilateral    Time 4   Period Weeks   Status New     PT SHORT TERM GOAL #4   Title Patient will improve wrist extensor  strength to 4+/5 bilateral    Time 4   Period Weeks   Status New  PT SHORT TERM GOAL #5   Title Patient will be independent with program for stretching and strengthening    Time 4   Period Weeks   Status New           PT Long Term Goals - 09/29/16 1024      PT LONG TERM GOAL #1   Title Patient will pull patient at work up in bed without increased symptoms    Time 8   Period Weeks   Status New     PT LONG TERM GOAL #2   Title Patient will use her hands for daily tasks without her hands going numb    Time 8   Period Weeks   Status New     PT LONG TERM GOAL #3   Title Patient will increase bilateral grip strength to 45 pounds bilateral in order to hold objects.    Time 8   Period Weeks   Status New               Plan - 09/29/16 1014    Clinical Impression Statement Patient is a 35 year old female with bilateral numbness that begins in her elbows and follows a radial nerve distrabution. She had a positive radial tinnels test. She has a negative tennwels for carpel tunnel. She has a significant decrease in supination flexability. At work when she uses her arms she has increased symptoms. Her neck special tests were negative. She has a weak grip bilaterally. She has significant tenderness to palpation bilateral around her lateral epicondyle and on the left folling her ECRB. She would benefit from skilled therapy to adress the above deficits. After given stretching exercises patient continued to do them throughout the remainder of the session. Patient strongly advised not to overdo her exercises. Iontophorises applied to reduce the inflammation.     Rehab Potential Fair   Clinical Impairments Affecting Rehab Potential morbid obsity which effcts her mechanics when lifting; long standing pain and numbness    PT Frequency 2x / week   PT Duration 8 weeks   PT Treatment/Interventions ADLs/Self Care Home Management;Electrical Stimulation;Iontophoresis 4mg /ml  Dexamethasone;Moist Heat;Cryotherapy;Functional mobility training;Therapeutic activities;Therapeutic exercise;Neuromuscular re-education;Manual techniques;Patient/family education;Passive range of motion;Dry needling;Energy conservation;Taping;Visual/perceptual remediation/compensation   PT Next Visit Plan IASTYM to bilateral lateral epicondyle area. Manual stretching of wrist extensors, light strethening of extendors, Improve elbow supination. elbow flexion    PT Home Exercise Plan supination/ pronation; wrist extnesion/ flexion; self supination stretch, towel grip,    Consulted and Agree with Plan of Care Patient      Patient will benefit from skilled therapeutic intervention in order to improve the following deficits and impairments:  Obesity, Pain, Decreased strength, Decreased activity tolerance, Decreased endurance, Impaired UE functional use  Visit Diagnosis: Pain in left elbow - Plan: PT plan of care cert/re-cert  Pain in right elbow - Plan: PT plan of care cert/re-cert  Muscle weakness (generalized) - Plan: PT plan of care cert/re-cert  Abnormal posture - Plan: PT plan of care cert/re-cert  Stiffness of left elbow, not elsewhere classified - Plan: PT plan of care cert/re-cert  Stiffness of right elbow, not elsewhere classified - Plan: PT plan of care cert/re-cert     Problem List Patient Active Problem List   Diagnosis Date Noted  . Pain in both upper arms 09/05/2016  . Left ankle pain 06/26/2016  . Tremor 12/20/2015  . Anemia 06/01/2015  . Numbness and tingling in both hands 05/28/2015  . Health care maintenance 12/23/2013  .  FSGS (focal segmental glomerulosclerosis) 07/02/2012  . Hyperlipidemia 10/25/2006  . Severe obesity (BMI >= 40) (HCC) 10/25/2006  . Essential hypertension 10/25/2006    Dessie Coma 09/29/2016, 10:52 AM  [facility] [address] [place], [place], [address] Phone: [phone]   Fax:   [phone]  Name: CORTINA VULTAGGIO MRN: [id number] Date of Birth: Apr 02, 1981

## 2016-10-03 ENCOUNTER — Ambulatory Visit: Payer: BLUE CROSS/BLUE SHIELD | Admitting: Physical Therapy

## 2016-10-10 ENCOUNTER — Ambulatory Visit: Payer: BLUE CROSS/BLUE SHIELD | Admitting: Physical Therapy

## 2016-10-13 ENCOUNTER — Ambulatory Visit: Payer: BLUE CROSS/BLUE SHIELD | Admitting: Physical Therapy

## 2016-10-24 ENCOUNTER — Ambulatory Visit: Payer: BLUE CROSS/BLUE SHIELD | Attending: Internal Medicine | Admitting: Physical Therapy

## 2016-10-24 DIAGNOSIS — M25521 Pain in right elbow: Secondary | ICD-10-CM | POA: Diagnosis present

## 2016-10-24 DIAGNOSIS — M6281 Muscle weakness (generalized): Secondary | ICD-10-CM | POA: Insufficient documentation

## 2016-10-24 DIAGNOSIS — M25622 Stiffness of left elbow, not elsewhere classified: Secondary | ICD-10-CM | POA: Diagnosis present

## 2016-10-24 DIAGNOSIS — M25522 Pain in left elbow: Secondary | ICD-10-CM | POA: Diagnosis not present

## 2016-10-24 DIAGNOSIS — M25621 Stiffness of right elbow, not elsewhere classified: Secondary | ICD-10-CM | POA: Diagnosis present

## 2016-10-24 DIAGNOSIS — R293 Abnormal posture: Secondary | ICD-10-CM | POA: Diagnosis present

## 2016-10-24 NOTE — Therapy (Signed)
[place], [place], [address] Phone: [phone]   Fax:  [phone]  Physical Therapy Treatment  Patient Details  Name: Deborah Gonzales MRN: [id number] Date of Birth: [birth year] Referring Provider: Dr Lindie Spruce   Encounter Date: 10/24/2016      PT End of Session - 10/24/16 1239    Visit Number 2   Number of Visits 16   Date for PT Re-Evaluation 11/24/16   PT Start Time 0930   PT Stop Time 1015   PT Time Calculation (min) 45 min   Activity Tolerance Patient tolerated treatment well   Behavior During Therapy [facility] for tasks assessed/performed      Past Medical History:  Diagnosis Date  . 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 On appropriate therapy with ACE-i  . Hyperlipidemia   . Hypertension   . Severe obesity (BMI >= 40) (HCC)    BMI 55    Past Surgical History:  Procedure Laterality Date  . RENAL BIOPSY  1999    There were no vitals filed for this visit.      Subjective Assessment - 10/24/16 0939    Subjective Right hurts more than left.  She has been di=oing her exercises.  No improvements noticed yet.   Currently in Pain? Yes   Pain Score 7   can go higher   Pain Location --  forearm.  Last week pain went to shoulder and rt anterior chest,  sharp , stiff, pain with movement.  It lasted for a week then went away   Pain Orientation Right;Left   Pain Descriptors / Indicators Sharp   Pain Onset More than a month ago   Pain Frequency Intermittent   Aggravating Factors  getting cold, lifting , dressing.   Pain Relieving Factors rest and heat, medication   Effect of Pain on Daily Activities cannot lift heavy things, work task difficult.  Drops money, cups hard to grip sometimes.                         [facility] PT Treatment/Exercise - 10/24/16 0001      Elbow Exercises   Other elbow  exercises AROM wrist, elbow,      Modalities   Modalities Iontophoresis     Iontophoresis   Type of Iontophoresis Dexamethasone   Location bilateral lateral epicondyle    Dose 1cc each   Time slow release patch;      Manual Therapy   Manual Therapy --  Myofascial release forearm   Manual therapy comments stretching, retorgrade soft tissue,  mobilization of radius.                  PT Short Term Goals - 10/24/16 1308      PT SHORT TERM GOAL #1   Title Patient will increase elbow supination by 25 degrees    Baseline still limited   Time 4   Period Weeks   Status On-going     PT SHORT TERM GOAL #2   Title Patient will report decreased pain and numbness into her hands    Baseline unchanged   Time 4   Period Weeks   Status On-going     PT SHORT TERM GOAL #3   Title Patient will improve grip strength by 10lbs bilateral    Time 4   Period Weeks   Status Unable to assess  PT SHORT TERM GOAL #4   Title Patient will improve wrist extensor strength to 4+/5 bilateral    Time 4   Period Weeks   Status Unable to assess     PT SHORT TERM GOAL #5   Title Patient will be independent with program for stretching and strengthening    Baseline independent with exercises issued so far   Time 4   Period Weeks   Status On-going           PT Long Term Goals - 09/29/16 1024      PT LONG TERM GOAL #1   Title Patient will pull patient at work up in bed without increased symptoms    Time 8   Period Weeks   Status New     PT LONG TERM GOAL #2   Title Patient will use her hands for daily tasks without her hands going numb    Time 8   Period Weeks   Status New     PT LONG TERM GOAL #3   Title Patient will increase bilateral grip strength to 45 pounds bilateral in order to hold objects.    Time 8   Period Weeks   Status New               Plan - 10/24/16 1240    Clinical Impression Statement Patient has not seen functional improvement yet.  She is  compliant with her HEP.  No new goals met   PT Next Visit Plan IASTYM to bilateral lateral epicondyle area. Manual stretching of wrist extensors, light strethening of extendors, Improve elbow supination. elbow flexion    PT Home Exercise Plan supination/ pronation; wrist extnesion/ flexion; self supination stretch, towel grip,    Consulted and Agree with Plan of Care Patient      Patient will benefit from skilled therapeutic intervention in order to improve the following deficits and impairments:  Obesity, Pain, Decreased strength, Decreased activity tolerance, Decreased endurance, Impaired UE functional use  Visit Diagnosis: Pain in left elbow  Pain in right elbow  Muscle weakness (generalized)  Abnormal posture  Stiffness of left elbow, not elsewhere classified  Stiffness of right elbow, not elsewhere classified     Problem List Patient Active Problem List   Diagnosis Date Noted  . Pain in both upper arms 09/05/2016  . Left ankle pain 06/26/2016  . Tremor 12/20/2015  . Anemia 06/01/2015  . Numbness and tingling in both hands 05/28/2015  . Health care maintenance 12/23/2013  . FSGS (focal segmental glomerulosclerosis) 07/02/2012  . Hyperlipidemia 10/25/2006  . Severe obesity (BMI >= 40) ([place]) 10/25/2006  . Essential hypertension 10/25/2006    HARRIS,KAREN PTA 10/24/2016, 1:10 PM  [facility] [address] [place], [place], [address] Phone: [phone]   Fax:  [phone]  Name: Deborah Gonzales MRN: [id number] Date of Birth: 1980/11/16

## 2016-10-27 ENCOUNTER — Ambulatory Visit: Payer: BLUE CROSS/BLUE SHIELD | Admitting: Physical Therapy

## 2016-10-27 DIAGNOSIS — M6281 Muscle weakness (generalized): Secondary | ICD-10-CM

## 2016-10-27 DIAGNOSIS — M25522 Pain in left elbow: Secondary | ICD-10-CM

## 2016-10-27 DIAGNOSIS — M25622 Stiffness of left elbow, not elsewhere classified: Secondary | ICD-10-CM

## 2016-10-27 DIAGNOSIS — R293 Abnormal posture: Secondary | ICD-10-CM

## 2016-10-27 DIAGNOSIS — M25521 Pain in right elbow: Secondary | ICD-10-CM

## 2016-10-30 NOTE — Therapy (Signed)
[facility] [address] [place], [place], [address] Phone: [phone]   Fax:  [phone]  Physical Therapy Treatment  Patient Details  Name: Deborah Gonzales MRN: [id number] Date of Birth: 10/20/1981 Referring Provider: Dr Woodroe ModeMichael Osullivan   Encounter Date: 10/27/2016      PT End of Session - 10/30/16 0746    Visit Number 3   Number of Visits 16   Date for PT Re-Evaluation 11/24/16   Authorization Type [employer] 20% co-pay    PT Start Time 0930   PT Stop Time 1014   PT Time Calculation (min) 44 min   Activity Tolerance Patient tolerated treatment well   Behavior [facility] for tasks assessed/performed      Past Medical History:  Diagnosis Date  . 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 On appropriate therapy with ACE-i  . Hyperlipidemia   . Hypertension   . Severe obesity (BMI >= 40) (HCC)    BMI 55    Past Surgical History:  Procedure Laterality Date  . RENAL BIOPSY  1999    There were no vitals filed for this visit.      Subjective Assessment - 10/30/16 0752    Subjective Patient reports her left arm has not been bothering her. 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 Her pain is mostry into her right elbow.    Patient is accompained by: Family member   Limitations Lifting;Writing;House hold activities   How long can you sit comfortably? N/A   How long can you stand comfortably? N/A   How long can you walk comfortably? N/A    Diagnostic tests MRI of cervical spine: Mild c3-c4 disc bulging    Patient Stated Goals To have less numbness in her hands    Currently in Pain? Yes   Pain Score 7    Pain Orientation Right   Pain Descriptors / Indicators Sharp   Pain Type Acute pain   Pain Onset More than a month ago   Pain Frequency Intermittent   Aggravating Factors  getting cold, lifting, dressing    Pain Relieving Factors rest and heat, medication    Effect of Pain on Daily Activities cannot lift heavy things, work tasks.                                    PT Short Term Goals - 10/30/16 0749      PT SHORT TERM GOAL #1   Title Patient will increase elbow supination by 25 degrees    Baseline still limited   Time 4   Period Weeks   Status On-going     PT SHORT TERM GOAL #2   Title Patient will report decreased pain and numbness into her hands    Baseline unchanged   Time 4   Period Weeks   Status On-going     PT SHORT TERM GOAL #3   Title Patient will improve grip strength by 10lbs bilateral    Time 4   Period Weeks   Status Unable to assess     PT SHORT TERM GOAL #5   Title Patient will be independent with program for stretching and strengthening    Baseline independent with exercises issued so far  Time 4   Period Weeks   Status On-going           PT Long Term Goals - 09/29/16 1024      PT LONG TERM GOAL #1   Title Patient will pull patient at work up in bed without increased symptoms    Time 8   Period Weeks   Status New     PT LONG TERM GOAL #2   Title Patient will use her hands for daily tasks without her hands going numb    Time 8   Period Weeks   Status New     PT LONG TERM GOAL #3   Title Patient will increase bilateral grip strength to 45 pounds bilateral in order to hold objects.    Time 8   Period Weeks   Status New               Plan - 10/30/16 0747    Clinical Impression Statement Patient has pain in her right elbow but it is more centralized around her lateral epicondyle. She has had no pain in her left hand. She continues to have pain at work.    Rehab Potential Fair   Clinical Impairments Affecting Rehab Potential morbid obsity which effcts her mechanics when lifting; long standing pain and numbness    PT Frequency 2x / week   PT Duration 8  weeks   PT Treatment/Interventions ADLs/Self Care Home Management;Electrical Stimulation;Iontophoresis 4mg /ml Dexamethasone;Moist Heat;Cryotherapy;Functional mobility training;Therapeutic activities;Therapeutic exercise;Neuromuscular re-education;Manual techniques;Patient/family education;Passive range of motion;Dry needling;Energy conservation;Taping;Visual/perceptual remediation/compensation   PT Next Visit Plan IASTYM to bilateral lateral epicondyle area. Manual stretching of wrist extensors, light strethening of extendors, Improve elbow supination. elbow flexion    PT Home Exercise Plan supination/ pronation; wrist extnesion/ flexion; self supination stretch, towel grip,    Consulted and Agree with Plan of Care Patient      Patient will benefit from skilled therapeutic intervention in order to improve the following deficits and impairments:  Obesity, Pain, Decreased strength, Decreased activity tolerance, Decreased endurance, Impaired UE functional use  Visit Diagnosis: Pain in left elbow  Pain in right elbow  Muscle weakness (generalized)  Abnormal posture  Stiffness of left elbow, not elsewhere classified     Problem List Patient Active Problem List   Diagnosis Date Noted  . Pain in both upper arms 09/05/2016  . Left ankle pain 06/26/2016  . Tremor 12/20/2015  . Anemia 06/01/2015  . Numbness and tingling in both hands 05/28/2015  . Health care maintenance 12/23/2013  . FSGS (focal segmental glomerulosclerosis) 07/02/2012  . Hyperlipidemia 10/25/2006  . Severe obesity (BMI >= 40) (HCC) 10/25/2006  . Essential hypertension 10/25/2006    Deborah Gonzales 10/30/2016, 7:53 AM  [facility] [address] [place], [place], [address] Phone: [phone]   Fax:  [phone]  Name: Deborah Gonzales MRN: [id number] Date of Birth: 05/10/1981

## 2016-10-31 ENCOUNTER — Ambulatory Visit: Payer: BLUE CROSS/BLUE SHIELD | Admitting: Physical Therapy

## 2016-10-31 DIAGNOSIS — M25622 Stiffness of left elbow, not elsewhere classified: Secondary | ICD-10-CM

## 2016-10-31 DIAGNOSIS — M6281 Muscle weakness (generalized): Secondary | ICD-10-CM

## 2016-10-31 DIAGNOSIS — M25521 Pain in right elbow: Secondary | ICD-10-CM

## 2016-10-31 DIAGNOSIS — M25522 Pain in left elbow: Secondary | ICD-10-CM | POA: Diagnosis not present

## 2016-10-31 DIAGNOSIS — R293 Abnormal posture: Secondary | ICD-10-CM

## 2016-10-31 DIAGNOSIS — M25621 Stiffness of right elbow, not elsewhere classified: Secondary | ICD-10-CM

## 2016-10-31 NOTE — Therapy (Addendum)
[place], [place], [address] Phone: [phone]   Fax:  [phone]  Physical Therapy Treatment  Patient Details  Name: Deborah Gonzales MRN: [id number] Date of Birth: Oct 06, 1981 Referring Provider: Dr Lindie Spruce   Encounter Date: 10/31/2016      PT End of Session - 10/31/16 1225    Visit Number 4   Number of Visits 16   Date for PT Re-Evaluation 11/24/16   PT Start Time 0933   PT Stop Time 1015   PT Time Calculation (min) 42 min   Activity Tolerance Patient tolerated treatment well   Behavior During Therapy [facility] for tasks assessed/performed      Past Medical History:  Diagnosis Date  . 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 On appropriate therapy with ACE-i  . Hyperlipidemia   . Hypertension   . Severe obesity (BMI >= 40) (HCC)    BMI 55    Past Surgical History:  Procedure Laterality Date  . RENAL BIOPSY  1999    There were no vitals filed for this visit.      Subjective Assessment - 10/31/16 0936    Subjective Pain mild.  Improved 80% because I have been avoiding lifting.  Able to do her exercises.    Currently in Pain? Yes   Pain Score --  mild   Pain Location Elbow   Pain Orientation Left;Right   Pain Descriptors / Indicators Sharp;Numbness   Pain Frequency Intermittent   Aggravating Factors  lifting, too much cold   Pain Relieving Factors rest, avoiding lifting,  tylenol, heat,  wrapping   Effect of Pain on Daily Activities cannot lift                         [facility] Adult PT Treatment/Exercise - 10/31/16 0001      Exercises   Exercises Wrist  wrist stretch with hands together 10 X   Other Exercises  red band wrist extension 10 X     Elbow Exercises   Forearm Supination 10 reps  both, mild diccomfort end range   Theraband Level (Supination) --  3 LBS barbell   Forearm Pronation 10  reps   Theraband Level (Pronation) --  3 LBD barbell  Mild discomfort both endrange   Other elbow exercises AROM wrist, elbow,      Shoulder Exercises: ROM/Strengthening   Other ROM/Strengthening Exercises row 10 X red band both.    cues   Other ROM/Strengthening Exercises ER 10 X red band sitting  cues.  No elbow pain.  7/10 anterior shoulder both, pain     Modalities   Modalities Iontophoresis     Iontophoresis   Type of Iontophoresis Dexamethasone   Location right   Dose 1cc   Time slow release patch;      Manual Therapy   Manual therapy comments stretching, retorgrade soft tissue,  right only today.  Anterior shoulder very sensitive.  instrument assist  for part.  Lymph node work axilla, neck                  PT Short Term Goals - 10/31/16 1228      PT SHORT TERM GOAL #2   Title Patient will report decreased pain and numbness into her hands    Baseline decreased   Time 4   Period Weeks   Status Achieved  PT Long Term Goals - 09/29/16 1024      PT LONG TERM GOAL #1   Title Patient will pull patient at work up in bed without increased symptoms    Time 8   Period Weeks   Status New     PT LONG TERM GOAL #2   Title Patient will use her hands for daily tasks without her hands going numb    Time 8   Period Weeks   Status New     PT LONG TERM GOAL #3   Title Patient will increase bilateral grip strength to 45 pounds bilateral in order to hold objects.    Time 8   Period Weeks   Status New        PHYSICAL THERAPY DISCHARGE SUMMARY  Visits from Start of Care: 4  Current functional level related to goals / functional outcomes: Did not return    Remaining deficits: Did not return    Education / Equipment: Did not return   Plan: Patient agrees to discharge.  Patient goals were not met. Patient is being discharged due to meeting the stated rehab goals.  ?????            Plan - 10/31/16 1226    Clinical Impression Statement  Pain in right elbow>Left today.  She has a new job with less physical requirements.  slight increase soreness right elbow after session.  STG#2 met.  Less edems into hands.   PT Next Visit Plan IASTYM to bilateral lateral epicondyle area. Manual stretching of wrist extensors, light strethening of extendors, Improve elbow supination. elbow flexion  Measure grip.   PT Home Exercise Plan supination/ pronation; wrist extnesion/ flexion; self supination stretch, towel grip,    Consulted and Agree with Plan of Care Patient      Patient will benefit from skilled therapeutic intervention in order to improve the following deficits and impairments:  Obesity, Pain, Decreased strength, Decreased activity tolerance, Decreased endurance, Impaired UE functional use  Visit Diagnosis: Pain in left elbow  Pain in right elbow  Muscle weakness (generalized)  Abnormal posture  Stiffness of left elbow, not elsewhere classified  Stiffness of right elbow, not elsewhere classified     Problem List Patient Active Problem List   Diagnosis Date Noted  . Pain in both upper arms 09/05/2016  . Left ankle pain 06/26/2016  . Tremor 12/20/2015  . Anemia 06/01/2015  . Numbness and tingling in both hands 05/28/2015  . Health care maintenance 12/23/2013  . FSGS (focal segmental glomerulosclerosis) 07/02/2012  . Hyperlipidemia 10/25/2006  . Severe obesity (BMI >= 40) ([place]) 10/25/2006  . Essential hypertension 10/25/2006   Carolyne Littles PT DPT  04/26/2017  Marius Betts PTA 10/31/2016, 12:29 PM  [facility] [address] [place], [place], [address] Phone: [phone]   Fax:  [phone]  Name: JOOD RETANA MRN: [id number] Date of Birth: 19-May-1981

## 2016-11-03 ENCOUNTER — Ambulatory Visit: Payer: BLUE CROSS/BLUE SHIELD | Admitting: Physical Therapy

## 2017-02-15 ENCOUNTER — Other Ambulatory Visit: Payer: Self-pay | Admitting: Internal Medicine

## 2017-04-20 ENCOUNTER — Encounter: Payer: Self-pay | Admitting: *Deleted

## 2017-05-14 ENCOUNTER — Ambulatory Visit: Payer: BLUE CROSS/BLUE SHIELD

## 2017-05-21 ENCOUNTER — Ambulatory Visit (INDEPENDENT_AMBULATORY_CARE_PROVIDER_SITE_OTHER): Payer: No Typology Code available for payment source | Admitting: Internal Medicine

## 2017-05-21 ENCOUNTER — Encounter: Payer: Self-pay | Admitting: Internal Medicine

## 2017-05-21 VITALS — BP 122/94 | HR 91 | Temp 98.3°F | Ht 65.0 in | Wt 360.2 lb

## 2017-05-21 DIAGNOSIS — R7303 Prediabetes: Secondary | ICD-10-CM

## 2017-05-21 DIAGNOSIS — M79622 Pain in left upper arm: Secondary | ICD-10-CM

## 2017-05-21 DIAGNOSIS — E7800 Pure hypercholesterolemia, unspecified: Secondary | ICD-10-CM

## 2017-05-21 DIAGNOSIS — D509 Iron deficiency anemia, unspecified: Secondary | ICD-10-CM

## 2017-05-21 DIAGNOSIS — M79621 Pain in right upper arm: Secondary | ICD-10-CM | POA: Diagnosis not present

## 2017-05-21 DIAGNOSIS — E785 Hyperlipidemia, unspecified: Secondary | ICD-10-CM

## 2017-05-21 DIAGNOSIS — I872 Venous insufficiency (chronic) (peripheral): Secondary | ICD-10-CM | POA: Insufficient documentation

## 2017-05-21 DIAGNOSIS — I878 Other specified disorders of veins: Secondary | ICD-10-CM

## 2017-05-21 HISTORY — DX: Venous insufficiency (chronic) (peripheral): I87.2

## 2017-05-21 NOTE — Progress Notes (Addendum)
CC: follow up of upper extremity pain   HPI:  Deborah Gonzales is a 36 y.o. with PMH hypertension, hyperlipidemia, history of FSGS, and obesity who presents for follow up after office evaluation for bilateral upper extremity pain related to exertion. Since that visit she has worked with physical therapy on conditioning and changed to a job which requires less lifting and her symptoms have improved. She has no acute concerns or complaints today so we addressed oreventative maintenance.  Past Medical History:  Diagnosis Date  . 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 On appropriate therapy with ACE-i  . Hyperlipidemia   . Hypertension   . Severe obesity (BMI >= 40) (HCC)    BMI 55   Review of Systems:  Please review to HPI and assessment and plans tab for pertinent review of systems, all others reviewed and negative.   Physical Exam:  Vitals:   05/21/17 0857  BP: (!) 122/94  Pulse: 91  Temp: 98.3 F (36.8 C)  TempSrc: Oral  SpO2: 98%  Weight: (!) 360 lb 3.2 oz (163.4 kg)  Height: 5\' 5"  (1.651 m)   Physical Exam  Constitutional: She is oriented to person, place, and time. She appears well-developed and well-nourished. No distress.  HENT:  Head: Normocephalic and atraumatic.  Eyes: Conjunctivae are normal. Right eye exhibits no discharge. Left eye exhibits no discharge. No scleral icterus.  Neck: Normal range of motion.  Cardiovascular: Normal rate, regular rhythm and intact distal pulses.   No murmur heard. 1+ bilateral lower extremity pitting edema, Pulses in right foot > Right. ABI >.90 in both lower extremities.   Pulmonary/Chest: Effort normal. No respiratory distress. She has no wheezes. She has no rales.  Neurological: She is alert and oriented to person, place, and time.  Skin: Skin is warm and dry. She is not diaphoretic.  Psychiatric: She has a normal mood and affect. Her behavior is normal.    Assessment & Plan:   Venous insufficiency  Lower extremity pitting edema was noticed on exam today. Patient says this is worse with eating salty meals and improves when she elevates her legs and takes HCTZ. Bedside ABIs performed in clinic today, did not show arterial insufficiency. This swelling is due to venous stasis.  - ABIs performed in clinic today >> no arterial insufficiency  - measured for compression stocking today, when these arrive to the clinic they will be mailed to her home   Prediabetes  Last hemoglocin A1c 11/2015 5.8, no random blood glucose captured since that time. Has severe morbid obesity and metabolic syndrome. Does report polydipsia, denies polyuria.  - Repeat A1c today    ADDENDUM: A1c 5.5, will continue to monitor   History of iron deficiency anemia  Iron deficiency anemia, she has been on ferrous sulfate supplementation in the past and hemoglobin responded well to this. She is not currently on ferrous sulfate supplementation. does not report symptoms of anemia at this time.  - ordered CBC today   Hemoglobin today 12.2 with MCV 83, no further iron supplementation needed at this time, will continue to monitor.   Hyperlipidemia Last lipid panel August 2017 showed a total cholesterol of over 200 and LDL is 132. She declines starting a statin today, she is worried about the side effects of starting a new medication. She understands the risk of stroke and MI related to uncontrolled hyperlipidemia.   Health maintenance  The patient has not had  a pap smear in the past. She has never been sexually active and declined pap smear today.  - Perform pap smear if she becomes sexually active   See Encounters Tab for problem based charting.  Patient discussed with Dr. Criselda Peaches

## 2017-05-21 NOTE — Assessment & Plan Note (Signed)
Last lipid panel August 2017 showed a total cholesterol of over 200 and LDL is 132. She declines starting a statin today, she is worried about the side effects of starting a new medication. She understands the risk of stroke and MI related to uncontrolled hyperlipidemia.

## 2017-05-21 NOTE — Assessment & Plan Note (Addendum)
Last hemoglocin A1c 11/2015 5.8, no random blood glucose captured since that time. Has severe morbid obesity and metabolic syndrome. Does report polydipsia, denies polyuria.  - Repeat A1c today    ADDENDUM: A1c 5.5, continue to monitor

## 2017-05-21 NOTE — Assessment & Plan Note (Signed)
Iron deficiency anemia, she has been on ferrous sulfate supplementation in the past and hemoglobin responded well to this. She is not currently on ferrous sulfate supplementation. does not report symptoms of anemia at this time.  - ordered CBC today

## 2017-05-21 NOTE — Patient Instructions (Signed)
Ms. Deborah Gonzales,   Josefa Halfm glad things are going better with your arm pain today.   Someone from the clinic will call and contact you with the results of the labwork from today.   Please schedule a follow up appointment with your primary care doctor in 2 months.

## 2017-05-21 NOTE — Assessment & Plan Note (Signed)
After last presentation for exertional bilateral arm pain she went for physical therapy and work on conditioning. She has also changed to a job which requires less lifting and her extremity pain is improved.  - Continue to monitor

## 2017-05-21 NOTE — Assessment & Plan Note (Signed)
Lower extremity pitting edema was noticed on exam today. Patient says this is worse with eating salty meals and improves when she elevates her legs and takes HCTZ. Bedside ABIs performed in clinic today, did not show arterial insufficiency. This swelling is due to venous stasis.  - ABIs performed in clinic today >> no arterial insufficiency  - measured for compression stocking today, when these arrive to the clinic they will be mailed to her home

## 2017-05-22 LAB — HEMOGLOBIN A1C
Est. average glucose Bld gHb Est-mCnc: 111 mg/dL
Hgb A1c MFr Bld: 5.5 % (ref 4.8–5.6)

## 2017-05-22 LAB — CBC
HEMATOCRIT: 37.4 % (ref 34.0–46.6)
HEMOGLOBIN: 12.2 g/dL (ref 11.1–15.9)
MCH: 26.9 pg (ref 26.6–33.0)
MCHC: 32.6 g/dL (ref 31.5–35.7)
MCV: 83 fL (ref 79–97)
Platelets: 364 10*3/uL (ref 150–379)
RBC: 4.53 x10E6/uL (ref 3.77–5.28)
RDW: 15 % (ref 12.3–15.4)
WBC: 5.9 10*3/uL (ref 3.4–10.8)

## 2017-05-22 NOTE — Progress Notes (Signed)
Internal Medicine Clinic Attending  Case discussed with Dr. Blum at the time of the visit.  We reviewed the resident's history and exam and pertinent patient test results.  I agree with the assessment, diagnosis, and plan of care documented in the resident's note. 

## 2017-06-28 ENCOUNTER — Other Ambulatory Visit: Payer: Self-pay | Admitting: Internal Medicine

## 2017-06-28 DIAGNOSIS — I10 Essential (primary) hypertension: Secondary | ICD-10-CM

## 2017-11-19 ENCOUNTER — Encounter (INDEPENDENT_AMBULATORY_CARE_PROVIDER_SITE_OTHER): Payer: Self-pay

## 2017-11-19 ENCOUNTER — Other Ambulatory Visit: Payer: Self-pay

## 2017-11-19 ENCOUNTER — Ambulatory Visit (INDEPENDENT_AMBULATORY_CARE_PROVIDER_SITE_OTHER): Payer: No Typology Code available for payment source | Admitting: Internal Medicine

## 2017-11-19 ENCOUNTER — Encounter: Payer: Self-pay | Admitting: Internal Medicine

## 2017-11-19 DIAGNOSIS — R110 Nausea: Secondary | ICD-10-CM

## 2017-11-19 DIAGNOSIS — N041 Nephrotic syndrome with focal and segmental glomerular lesions: Secondary | ICD-10-CM

## 2017-11-19 DIAGNOSIS — I10 Essential (primary) hypertension: Secondary | ICD-10-CM

## 2017-11-19 DIAGNOSIS — R109 Unspecified abdominal pain: Secondary | ICD-10-CM

## 2017-11-19 DIAGNOSIS — R1013 Epigastric pain: Secondary | ICD-10-CM

## 2017-11-19 DIAGNOSIS — Z6841 Body Mass Index (BMI) 40.0 and over, adult: Secondary | ICD-10-CM | POA: Diagnosis not present

## 2017-11-19 DIAGNOSIS — E785 Hyperlipidemia, unspecified: Secondary | ICD-10-CM

## 2017-11-19 DIAGNOSIS — E669 Obesity, unspecified: Secondary | ICD-10-CM | POA: Diagnosis not present

## 2017-11-19 MED ORDER — OMEPRAZOLE 20 MG PO CPDR: 20.0000 mg | DELAYED_RELEASE_CAPSULE | Freq: Every day | ORAL | 0 refills | Status: DC

## 2017-11-19 NOTE — Patient Instructions (Addendum)
It was a pleasure to see you today Deborah Gonzales.  It sounds like your stomach pains may be due to inflammation of the stomach.  We will try a medication called Omeprazole (Prilosec) to reduce the inflammation. Take this 30 minutes before your first meal of the day. This medication is okay to use with your kidneys.  Please follow up with [place] in 4-6 weeks if your symptoms do not improve.  Omeprazole capsules (sprinkle caps) - Rx What is this medicine? OMEPRAZOLE (oh ME pray zol) prevents the production of acid in the stomach. It is used to treat gastroesophageal reflux disease (GERD), ulcers, certain bacteria in the stomach, inflammation of the esophagus, and Zollinger-Ellison Syndrome. It is also used to treat other conditions that cause too much stomach acid. This medicine may be used for other purposes; ask your health care provider or pharmacist if you have questions. COMMON BRAND NAME(S): Prilosec What should I tell my health care provider before I take this medicine? They need to know if you have any of these conditions: -liver disease -low levels of magnesium in the blood -lupus -an unusual or allergic reaction to omeprazole, other medicines, foods, dyes, or preservatives -pregnant or trying to get pregnant -breast-feeding How should I use this medicine? Take this medicine by mouth with a glass of water. Follow the directions on the prescription label. Do not crush, break or chew the capsules. They can be opened and the contents sprinkled on a small amount of applesauce or yogurt, given with fruit juices, or swallowed immediately with water. This medicine works best if taken on an empty stomach 30 to 60 minutes before breakfast. Take your doses at regular intervals. Do not take your medicine more often than directed. Talk to your pediatrician regarding the use of this medicine in children. Special care may be needed. Overdosage: If you think you have taken too much of this medicine contact  a poison control center or emergency room at once. NOTE: This medicine is only for you. Do not share this medicine with others. What if I miss a dose? If you miss a dose, take it as soon as you can. If it is almost time for your next dose, take only that dose. Do not take double or extra doses. What may interact with this medicine? Do not take this medicine with any of the following medications: -atazanavir -clopidogrel -nelfinavir This medicine may also interact with the following medications: -ampicillin -certain medicines for anxiety or sleep -certain medicines that treat or prevent blood clots like warfarin -cyclosporine -diazepam -digoxin -disulfiram -diuretics -iron salts -methotrexate -mycophenolate mofetil -phenytoin -prescription medicine for fungal or yeast infection like itraconazole, ketoconazole, voriconazole -saquinavir -tacrolimus This list may not describe all possible interactions. Give your health care provider a list of all the medicines, herbs, non-prescription drugs, or dietary supplements you use. Also tell them if you smoke, drink alcohol, or use illegal drugs. Some items may interact with your medicine. What should I watch for while using this medicine? It can take several days before your stomach pain gets better. Check with your doctor or health care professional if your condition does not start to get better, or if it gets worse. You may need blood work done while you are taking this medicine. What side effects may I notice from receiving this medicine? Side effects that you should report to your doctor or health care professional as soon as possible: -allergic reactions like skin rash, itching or hives, swelling of the face, lips, or  tongue -bone, muscle or joint pain -breathing problems -chest pain or chest tightness -dark yellow or brown urine -dizziness -fast, irregular heartbeat -feeling faint or lightheaded -fever or sore throat -muscle  spasm -palpitations -rash on cheeks or arms that gets worse in the sun -redness, blistering, peeling or loosening of the skin, including inside the mouth -seizures -tremors -unusual bleeding or bruising -unusually weak or tired -yellowing of the eyes or skin Side effects that usually do not require medical attention (report to your doctor or health care professional if they continue or are bothersome): -constipation -diarrhea -dry mouth -headache -nausea This list may not describe all possible side effects. Call your doctor for medical advice about side effects. You may report side effects to FDA at 1-800-FDA-1088. Where should I keep my medicine? Keep out of the reach of children. Store at room temperature between 15 and 30 degrees C (59 and 86 degrees F). Protect from light and moisture. Throw away any unused medicine after the expiration date. NOTE: This sheet is a summary. It may not cover all possible information. If you have questions about this medicine, talk to your doctor, pharmacist, or health care provider.  2018 Elsevier/Gold Standard (2015-12-02 12:18:47)

## 2017-11-19 NOTE — Assessment & Plan Note (Signed)
Ms. Deborah Gonzales reports intermittent stomach pain over the last 2 weeks. She says she ate a double cheeseburger at [employer] the day after Christmas. She had 2 days of diarrhea afterwards which was relieved with imodium and has since resolved. She has noticed intermittent episodes of epigastric stomach pain with most meals. These are associated with occasional nausea without emesis. She says her stomach pains can also occur when putting pressure on her stomach (laying on her stomach) after eating but not on an empty stomach. Her pain does not radiate.  She reports 1-2 bowel movements per day which are normal in consistency and color, without melena or hematochezia. She denies any intolerance to dairy or wheat products. She denies any heartburn or reflux. She denies any cough or SOB. She has noticed weight loss and has a 28 lb loss of weight since her last office visit 6 months ago. She denies any fevers, chills, or night sweats. She denies any change in appetite.  She denies any NSAID use and reports only taking Enalapril, HCTZ, and occasional Tylenol if needed. She reports good urine output without dysuria.  A/P: Her symptoms are suggestive of gastritis. There is low suspicion for pancreatitis as she is otherwise well appearing and able to tolerate oral diet without emesis. She does have a significant weight loss over the last 6 months without any fevers, chills, night sweats, or obvious GI bleeding. Will treat conservatively at this time with an oral PPI and evaluation of LFTs. If symptoms do not improve in 4 weeks she will see us again for reevaluation. - Check CMET - Omeprazole 20 mg daily

## 2017-11-19 NOTE — Progress Notes (Signed)
CC: Stomach Pain  HPI:  [name] is a 37 y.o. female with PMH as listed below including HTN, HLD, FSGS, and obesity who presents for evaluation of stomach pain. Please see problem based charting for status of patient's chronic medical issues.  Stomach pain [name] reports intermittent stomach pain over the last 2 weeks. She says she ate a double cheeseburger at [employer] the day after Christmas. She had 2 days of diarrhea afterwards which was relieved with imodium and has since resolved. She has noticed intermittent episodes of epigastric stomach pain with most meals. These are associated with occasional nausea without emesis. She says her stomach pains can also occur when putting pressure on her stomach (laying on her stomach) after eating but not on an empty stomach. Her pain does not radiate.  She reports 1-2 bowel movements per day which are normal in consistency and color, without melena or hematochezia. She denies any intolerance to dairy or wheat products. She denies any heartburn or reflux. She denies any cough or SOB. She has noticed weight loss and has a 28 lb loss of weight since her last office visit 6 months ago. She denies any fevers, chills, or night sweats. She denies any change in appetite.  She denies any NSAID use and reports only taking Enalapril, HCTZ, and occasional Tylenol if needed. She reports good urine output without dysuria.  A/P: Her symptoms are suggestive of gastritis. There is low suspicion for pancreatitis as she is otherwise well appearing and able to tolerate oral diet without emesis. She does have a significant weight loss over the last 6 months without any fevers, chills, night sweats, or obvious GI bleeding. Will treat conservatively at this time with an oral PPI and evaluation of LFTs. If symptoms do not improve in 4 weeks she will see [place] again for reevaluation. - Check CMET - Omeprazole 20 mg daily    Past Medical History:  Diagnosis  Date  . 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 On appropriate therapy with ACE-i  . Hyperlipidemia   . Hypertension   . Severe obesity (BMI >= 40) (HCC)    BMI 55   Review of Systems:   Review of Systems  Constitutional: Positive for weight loss. Negative for chills, diaphoresis and fever.  Respiratory: Negative for cough, hemoptysis and shortness of breath.   Cardiovascular: Negative for chest pain.  Gastrointestinal: Positive for abdominal pain and nausea. Negative for blood in stool, constipation, diarrhea, heartburn, melena and vomiting.  Genitourinary: Negative for dysuria and frequency.  Musculoskeletal: Negative for back pain.  Neurological: Negative for dizziness.     Physical Exam:  Vitals:   11/19/17 0902  BP: (!) 145/85  Pulse: 81  Temp: 98.2 F (36.8 C)  TempSrc: Oral  SpO2: 100%  Weight: (!) 332 lb 3.2 oz (150.7 kg)  Height: 5\' 5"  (1.651 m)   Physical Exam  Constitutional: She is oriented to person, place, and time. She appears well-developed and well-nourished. She does not appear ill. No distress.  Obese woman  HENT:  Head: Normocephalic and atraumatic.  Cardiovascular: Normal rate and regular rhythm.  Pulmonary/Chest: Effort normal. No respiratory distress. She has no wheezes. She has no rales.  Abdominal: Soft. Bowel sounds are normal. There is tenderness in the epigastric area. No hernia.  Obese abdomen  Neurological: She is alert and oriented to person, place, and time.  Skin: Skin is warm.    Assessment &  Plan:   See Encounters Tab for problem based charting.  Patient discussed with Dr. Criselda PeachesMullen

## 2017-11-20 LAB — CMP14 + ANION GAP
A/G RATIO: 1.2 (ref 1.2–2.2)
ALBUMIN: 3.8 g/dL (ref 3.5–5.5)
ALT: 11 IU/L (ref 0–32)
AST: 10 IU/L (ref 0–40)
Alkaline Phosphatase: 85 IU/L (ref 39–117)
Anion Gap: 15 mmol/L (ref 10.0–18.0)
BUN / CREAT RATIO: 19 (ref 9–23)
BUN: 11 mg/dL (ref 6–20)
Bilirubin Total: 0.3 mg/dL (ref 0.0–1.2)
CALCIUM: 9.2 mg/dL (ref 8.7–10.2)
CO2: 23 mmol/L (ref 20–29)
Chloride: 104 mmol/L (ref 96–106)
Creatinine, Ser: 0.57 mg/dL (ref 0.57–1.00)
GFR, EST AFRICAN AMERICAN: 136 mL/min/{1.73_m2} (ref >59)
GFR, EST NON AFRICAN AMERICAN: 118 mL/min/{1.73_m2} (ref >59)
GLOBULIN, TOTAL: 3.2 g/dL (ref 1.5–4.5)
GLUCOSE: 85 mg/dL (ref 65–99)
POTASSIUM: 4 mmol/L (ref 3.5–5.2)
Sodium: 142 mmol/L (ref 134–144)
TOTAL PROTEIN: 7 g/dL (ref 6.0–8.5)

## 2017-11-20 NOTE — Progress Notes (Signed)
Internal Medicine Clinic Attending  Case discussed with Dr. Patel at the time of the visit.  We reviewed the resident's history and exam and pertinent patient test results.  I agree with the assessment, diagnosis, and plan of care documented in the resident's note.  

## 2018-01-11 ENCOUNTER — Other Ambulatory Visit: Payer: Self-pay

## 2018-01-11 DIAGNOSIS — I10 Essential (primary) hypertension: Secondary | ICD-10-CM

## 2018-01-11 MED ORDER — HYDROCHLOROTHIAZIDE 25 MG PO TABS: 25.0000 mg | ORAL_TABLET | Freq: Every day | ORAL | 2 refills | Status: DC

## 2018-01-11 MED ORDER — ENALAPRIL MALEATE 20 MG PO TABS: 40.0000 mg | ORAL_TABLET | Freq: Every day | ORAL | 2 refills | Status: DC

## 2018-01-11 NOTE — Telephone Encounter (Signed)
Change to [facility] on [address]. Next appt scheduled  03/14/18 with PCP.

## 2018-01-11 NOTE — Telephone Encounter (Signed)
enalapril (VASOTEC) 20 MG tablet,  hydrochlorothiazide (HYDRODIURIL) 25 MG tablet, Refill request @ [facility] on [address]

## 2018-01-11 NOTE — Telephone Encounter (Signed)
refilled 

## 2018-03-14 ENCOUNTER — Encounter: Payer: No Typology Code available for payment source | Admitting: Internal Medicine

## 2018-04-03 ENCOUNTER — Ambulatory Visit (INDEPENDENT_AMBULATORY_CARE_PROVIDER_SITE_OTHER): Payer: No Typology Code available for payment source | Admitting: Internal Medicine

## 2018-04-03 ENCOUNTER — Other Ambulatory Visit: Payer: Self-pay

## 2018-04-03 ENCOUNTER — Encounter: Payer: Self-pay | Admitting: Internal Medicine

## 2018-04-03 DIAGNOSIS — Z8249 Family history of ischemic heart disease and other diseases of the circulatory system: Secondary | ICD-10-CM | POA: Diagnosis not present

## 2018-04-03 DIAGNOSIS — Z79899 Other long term (current) drug therapy: Secondary | ICD-10-CM

## 2018-04-03 DIAGNOSIS — I10 Essential (primary) hypertension: Secondary | ICD-10-CM | POA: Diagnosis not present

## 2018-04-03 DIAGNOSIS — Z6841 Body Mass Index (BMI) 40.0 and over, adult: Secondary | ICD-10-CM | POA: Diagnosis not present

## 2018-04-03 NOTE — Patient Instructions (Signed)
Keep up the good work in losing weight.  Your blood pressure looks good today, you are up to date on your immunizations and health screenings other than pap smear, please consider getting one we will revisit this next year.

## 2018-04-03 NOTE — Progress Notes (Signed)
CC: HTN follow up  HPI:  Deborah Gonzales is a 37 y.o. female with PMH below here to address her HTN and obesity.   Please see A&P for status of the patient's chronic medical conditions  Past Medical History:  Diagnosis Date  . 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 On appropriate therapy with ACE-i  . Hyperlipidemia   . Hypertension   . Severe obesity (BMI >= 40) (HCC)    BMI 55   Review of Systems:  ROS: Pulmonary: pt denies increased work of breathing, shortness of breath,  Cardiac: pt denies palpitations, chest pain,  Abdominal: pt denies abdominal pain, nausea, vomiting, or diarrhea  Physical Exam:  Vitals:   04/03/18 0849  BP: 140/88  Pulse: 79  Temp: 98.4 F (36.9 C)  TempSrc: Oral  SpO2: 96%  Weight: (!) 322 lb 8 oz (146.3 kg)  Height:  (1.676 m)   Physical Exam  Constitutional: No distress.  Cardiovascular: Normal rate, regular rhythm and normal heart sounds. Exam reveals no gallop and no friction rub.  No murmur heard. Pulmonary/Chest: Effort normal and breath sounds normal. No respiratory distress. She has no wheezes. She has no rales. She exhibits no tenderness.  Abdominal: Soft. Bowel sounds are normal. She exhibits no distension and no mass. There is no tenderness. There is no rebound and no guarding.  Neurological: She is alert.  Skin: She is not diaphoretic.    Social History   Socioeconomic History  . Marital status: Single    Spouse name: Not on file  . Number of children: 0  . Years of education: college  . Highest education level: Not on file  Occupational History  . Occupation: Occupational psychologist    Comment: CNA/med tech  Social Needs  . Financial resource strain: Not on file  . Food insecurity:    Worry: Not on file    Inability: Not on file  . Transportation needs:    Medical: Not on file    Non-medical: Not on file  Tobacco Use  . Smoking status: Never  Smoker  . Smokeless tobacco: Never Used  Substance and Sexual Activity  . Alcohol use: No    Alcohol/week: 0.0 oz  . Drug use: No  . Sexual activity: Not on file  Lifestyle  . Physical activity:    Days per week: Not on file    Minutes per session: Not on file  . Stress: Not on file  Relationships  . Social connections:    Talks on phone: Not on file    Gets together: Not on file    Attends religious service: Not on file    Active member of club or organization: Not on file    Attends meetings of clubs or organizations: Not on file    Relationship status: Not on file  . Intimate partner violence:    Fear of current or ex partner: Not on file    Emotionally abused: Not on file    Physically abused: Not on file    Forced sexual activity: Not on file  Other Topics Concern  . Not on file  Social History Narrative   Patient does not drink caffeine.   Patient is right handed.     Family History  Problem Relation Age of Onset  . Hypertension Mother   . Hypertension Father   . Diabetes Father     Assessment & Plan:   See  Encounters Tab for problem based charting.  Patient discussed with Dr. Beryle Beams

## 2018-04-03 NOTE — Assessment & Plan Note (Signed)
BP Readings from Last 3 Encounters:  04/03/18 140/88  11/19/17 (!) 145/85  05/21/17 (!) 122/94   BP within goal today.  Currently on enalapril  and HCTZ  daily  -continue current therapy above

## 2018-04-03 NOTE — Assessment & Plan Note (Signed)
down 10 lbs in last 3 months 50lbs over last 2 years.  Dietary changes less fast food, walks during work pharmacy delivery person, eats earlier in day doesn't eat before bed, eating less overall.  Drinks mostly water does drink some juice.   -encouraged pt to continue these positive habits and can work on slowly cutting out the juice or picking a juice flavored sparkling water that is sugar free.

## 2018-04-03 NOTE — Progress Notes (Signed)
Medicine attending: Medical history, presenting problems, physical findings, and medications, reviewed with resident physician Dr William Winfrey on the day of the patient visit and I concur with his evaluation and management plan. 

## 2018-06-25 ENCOUNTER — Encounter: Payer: Self-pay | Admitting: Internal Medicine

## 2019-01-30 ENCOUNTER — Other Ambulatory Visit: Payer: Self-pay | Admitting: Internal Medicine

## 2019-01-30 NOTE — Telephone Encounter (Signed)
refilled 

## 2019-03-17 ENCOUNTER — Other Ambulatory Visit: Payer: Self-pay | Admitting: Internal Medicine

## 2019-03-17 ENCOUNTER — Encounter: Payer: No Typology Code available for payment source | Admitting: Internal Medicine

## 2019-03-17 DIAGNOSIS — I10 Essential (primary) hypertension: Secondary | ICD-10-CM

## 2019-03-18 NOTE — Telephone Encounter (Signed)
Called pt - informed received refill request from her pharmacy and she needs to re-schedule her tele health visit from yesterday. Call transferred to front office - appt scheduled w/Dr Frances Furbish on 5/11.

## 2019-03-24 ENCOUNTER — Ambulatory Visit (INDEPENDENT_AMBULATORY_CARE_PROVIDER_SITE_OTHER): Payer: No Typology Code available for payment source | Admitting: Internal Medicine

## 2019-03-24 ENCOUNTER — Other Ambulatory Visit: Payer: Self-pay

## 2019-03-24 DIAGNOSIS — I10 Essential (primary) hypertension: Secondary | ICD-10-CM | POA: Diagnosis not present

## 2019-03-24 NOTE — Progress Notes (Signed)
   [facility] Telephone Encounter  Reason for call:   This telephone encounter was created for Ms. Deborah Gonzales on 03/24/2019 for the following purpose/cc HTN, Obesity.   Pertinent Data:   FSGS, HLD, Obesity, HTN ROS: Pulmonary: pt denies increased work of breathing, shortness of breath,  Cardiac: pt denies palpitations, chest pain,   Abdominal: pt denies abdominal pain, nausea, vomiting, or diarrhea   Assessment / Plan / Recommendations:   HTN: bp usually running around 120/80.  Only checks it at a pharmacy.  Last check Saturday and it was higher but she does need a refill on HCTZ has been out for 3 days.  I discussed with her this is likely why her bp was higher and that she had this re-prescribed on 5/7.  I encouraged her to please follow up with her nephrologist to address her FSGS and that we would need lab work from her in the next 1-2 months either by [place] or nephrology to continue to monitor her renal function.    P:   Continue enalapril 20, hctz 25  Nephrology follow up  Repeat bp/labs in 1-2 months pt will call and schedule appt per her preference  Severe Obesity: Says she has lost a little bit more weight.  She had a scale but it stopped working has to get a new one.  Last time she weighed herself was at our clinic. She does note that she has went down a pant size and down a shirt size. She has cut out sodas drinking more water, portion control, less fried foods.  She does walk and does some floor exercises.    P:   Continue working on weight loss  As always, pt is advised that if symptoms worsen or new symptoms arise, they should go to an urgent care facility or to to ER for further evaluation.   Consent and Medical Decision Making:   Patient discussed with Dr. Heide Spark  This is a telephone encounter between Ailene Rud and Thornell Mule on 03/24/2019 for HTN, Obesity.. The visit was conducted with the patient located at  home and Thornell Mule at [facility]. The patient's identity was confirmed using their DOB and current address. The patient has consented to being evaluated through a telephone encounter and understands the associated risks (an examination cannot be done and the patient may need to come in for an appointment) / benefits (allows the patient to remain at home, decreasing exposure to coronavirus). I personally spent 13 minutes on medical discussion.

## 2019-03-25 ENCOUNTER — Encounter: Payer: Self-pay | Admitting: Internal Medicine

## 2019-03-25 NOTE — Assessment & Plan Note (Addendum)
   HTN: bp usually running around 120/80.  Only checks it at a pharmacy.  Last check Saturday and it was higher but she does need a refill on HCTZ has been out for 3 days.  I discussed with her this is likely why her bp was higher and that she had this re-prescribed on 5/7.  I encouraged her to please follow up with her nephrologist to address her FSGS and that we would need lab work from her in the next 1-2 months either by [place] or nephrology to continue to monitor her renal function.    P:   Continue enalapril 20, hctz 25  Nephrology follow up  Repeat bp/labs in 1-2 months pt will call and schedule appt per her preference

## 2019-03-25 NOTE — Assessment & Plan Note (Signed)
   Severe Obesity: Says she has lost a little bit more weight.  She had a scale but it stopped working has to get a new one.  Last time she weighed herself was at our clinic. She does note that she has went down a pant size and down a shirt size. She has cut out sodas drinking more water, portion control, less fried foods.  She does walk and does some floor exercises.    P:   Continue working on weight loss

## 2019-03-26 NOTE — Progress Notes (Signed)
Internal Medicine Clinic Attending  Case discussed with Dr. Winfrey  at the time of the visit.  We reviewed the resident's history and exam and pertinent patient test results.  I agree with the assessment, diagnosis, and plan of care documented in the resident's note.  

## 2019-04-03 ENCOUNTER — Encounter: Payer: Self-pay | Admitting: Internal Medicine

## 2019-04-08 ENCOUNTER — Other Ambulatory Visit: Payer: Self-pay | Admitting: Internal Medicine

## 2019-04-08 DIAGNOSIS — R6889 Other general symptoms and signs: Secondary | ICD-10-CM

## 2019-04-09 ENCOUNTER — Encounter: Payer: Self-pay | Admitting: *Deleted

## 2019-07-13 ENCOUNTER — Encounter: Payer: Self-pay | Admitting: Internal Medicine

## 2019-08-25 ENCOUNTER — Ambulatory Visit (INDEPENDENT_AMBULATORY_CARE_PROVIDER_SITE_OTHER): Payer: No Typology Code available for payment source | Admitting: Internal Medicine

## 2019-08-25 ENCOUNTER — Encounter: Payer: Self-pay | Admitting: Internal Medicine

## 2019-08-25 ENCOUNTER — Other Ambulatory Visit: Payer: Self-pay

## 2019-08-25 VITALS — BP 146/97 | HR 99 | Temp 99.0°F | Ht 66.0 in | Wt 295.8 lb

## 2019-08-25 DIAGNOSIS — Z1231 Encounter for screening mammogram for malignant neoplasm of breast: Secondary | ICD-10-CM

## 2019-08-25 DIAGNOSIS — Z6841 Body Mass Index (BMI) 40.0 and over, adult: Secondary | ICD-10-CM | POA: Diagnosis not present

## 2019-08-25 DIAGNOSIS — Z79899 Other long term (current) drug therapy: Secondary | ICD-10-CM

## 2019-08-25 DIAGNOSIS — I10 Essential (primary) hypertension: Secondary | ICD-10-CM | POA: Diagnosis not present

## 2019-08-25 MED ORDER — DILTIAZEM HCL ER 120 MG PO CP24: 120.0000 mg | ORAL_CAPSULE | Freq: Every day | ORAL | 0 refills | Status: DC

## 2019-08-25 NOTE — Patient Instructions (Signed)
Deborah Gonzales, I have started you on another blood pressure medicine called diltiazem.  Please follow up in around two months to check on your blood pressure.

## 2019-08-25 NOTE — Progress Notes (Signed)
   CC: HTN, Obesity  HPI:  [name] is a 38 y.o. female with PMH below.  Today we will address HTN, Obesity  Please see A&P for status of the patient's chronic medical conditions  Past Medical History:  Diagnosis Date  . 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 On appropriate therapy with ACE-i  . Hyperlipidemia   . Hypertension   . Severe obesity (BMI >= 40) (HCC)    BMI 55   Review of Systems:  ROS: Pulmonary: pt denies increased work of breathing, shortness of breath,  Cardiac: pt denies palpitations, chest pain,  Abdominal: pt denies abdominal pain, nausea, vomiting, or diarrhea   Physical Exam:  Vitals:   08/25/19 1609  BP: (!) 146/97  Pulse: 99  Temp: 99 F (37.2 C)  TempSrc: Oral  SpO2: 100%  Weight: 295 lb 12.8 oz (134.2 kg)  Height: 5\' 6"  (1.676 m)   Cardiac:  normal rate and rhythm, clear s1 and s2, no murmurs, rubs or gallops Pulmonary: CTAB, not in distress Psych: Alert, conversant, in good spirits   Social History   Socioeconomic History  . Marital status: Single    Spouse name: Not on file  . Number of children: 0  . Years of education: college  . Highest education level: Not on file  Occupational History  . Occupation: Actuary    Comment: CNA/med tech  Social Needs  . Financial resource strain: Not on file  . Food insecurity    Worry: Not on file    Inability: Not on file  . Transportation needs    Medical: Not on file    Non-medical: Not on file  Tobacco Use  . Smoking status: Never Smoker  . Smokeless tobacco: Never Used  Substance and Sexual Activity  . Alcohol use: No    Alcohol/week: 0.0 standard drinks  . Drug use: No  . Sexual activity: Not on file  Lifestyle  . Physical activity    Days per week: Not on file    Minutes per session: Not on file  . Stress: Not on file  Relationships  . Social Herbalist on phone: Not on file   Gets together: Not on file    Attends religious service: Not on file    Active member of club or organization: Not on file    Attends meetings of clubs or organizations: Not on file    Relationship status: Not on file  . Intimate partner violence    Fear of current or ex partner: Not on file    Emotionally abused: Not on file    Physically abused: Not on file    Forced sexual activity: Not on file  Other Topics Concern  . Not on file  Social History Narrative   Patient does not drink caffeine.   Patient is right handed.     Family History  Problem Relation Age of Onset  . Hypertension Mother   . Hypertension Father   . Diabetes Father     Assessment & Plan:   See Encounters Tab for problem based charting.  Patient discussed with Dr. Rebeca Alert

## 2019-08-26 NOTE — Assessment & Plan Note (Signed)
changing eating habits mainly portion control.  Walking daily about 10-15 minutes.  Has lost 27lbs over the last year.     -encouraged continued dietary change and exercise

## 2019-08-26 NOTE — Assessment & Plan Note (Signed)
In the setting of FSGS.  Sees nephrology yearly next visit is next month.  BP elevated today she takes enalapril 20mg  and hctz 25mg .  She asked that we repeat her bp here, which was still elevated.  Suggested that we start her on diltiazem an additional agent.  She refused and would like to wait and speak with her nephrologist.

## 2019-08-28 NOTE — Progress Notes (Signed)
Internal Medicine Clinic Attending  Case discussed with Dr. Winfrey at the time of the visit.  We reviewed the resident's history and exam and pertinent patient test results.  I agree with the assessment, diagnosis, and plan of care documented in the resident's note.  Alexander Raines, M.D., Ph.D.  

## 2019-11-12 ENCOUNTER — Other Ambulatory Visit: Payer: Self-pay | Admitting: *Deleted

## 2019-11-12 DIAGNOSIS — I10 Essential (primary) hypertension: Secondary | ICD-10-CM

## 2019-11-12 MED ORDER — HYDROCHLOROTHIAZIDE 25 MG PO TABS: 25.0000 mg | ORAL_TABLET | Freq: Every day | ORAL | 0 refills | Status: DC

## 2019-12-03 ENCOUNTER — Ambulatory Visit: Payer: No Typology Code available for payment source

## 2020-01-28 ENCOUNTER — Other Ambulatory Visit: Payer: Self-pay | Admitting: Internal Medicine

## 2020-03-04 ENCOUNTER — Encounter: Payer: Self-pay | Admitting: *Deleted

## 2020-05-21 ENCOUNTER — Telehealth: Payer: Self-pay | Admitting: Student

## 2020-05-21 DIAGNOSIS — I10 Essential (primary) hypertension: Secondary | ICD-10-CM

## 2020-05-21 MED ORDER — HYDROCHLOROTHIAZIDE 25 MG PO TABS: 25.0000 mg | ORAL_TABLET | Freq: Every day | ORAL | 0 refills | Status: DC

## 2020-05-21 MED ORDER — ENALAPRIL MALEATE 20 MG PO TABS: 40.0000 mg | ORAL_TABLET | Freq: Every day | ORAL | 0 refills | Status: DC

## 2020-05-21 NOTE — Telephone Encounter (Signed)
Patient needs to be seen. Will forward to front desk to assist patient in making next available appt w/ PCP team. Will forward RX request for consideration. Thank you, SChaplin, RN,BSN

## 2020-05-21 NOTE — Telephone Encounter (Signed)
I agree, she will need a follow up appointment. I will refill the medications, but she will need an appointment in the next 1-2 weeks to follow up labs.

## 2020-05-21 NOTE — Telephone Encounter (Signed)
Need refill on hydrochlorothiazide (HYDRODIURIL) 25 MG tablet  enalapril (VASOTEC) 20 MG tablet ;pt contact [phone]   [facility] - [address] [place]

## 2020-05-27 NOTE — Telephone Encounter (Signed)
Just spoke with patient.  She agreed to an appointment for 06/18/2020 at 9:45 am with Dr. Gwyneth Revels.  Appointment card has been mailed.

## 2020-06-16 NOTE — Progress Notes (Signed)
   CC: Hypertension  HPI:  Deborah Gonzales is a 39 y.o. with history listed below including FSGS, hyperlipidemia, and hypertension presenting for follow-up of her hypertension. She denies any new complaints at this time. We did discuss the Covid and flu vaccine, she reported that she did not want to get either of these. Provided education on both of these. She also reported that she did not want to get a Pap smear done today, reports that she is not sexually active, informed her that this is a screening test that we used to evaluate for cancer and that is beneficial even if you are not sexually active. She is not interested in getting this at this time. Informed her to contact us if she changes her mind.    Past Medical History:  Diagnosis Date  . 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 On appropriate therapy with ACE-i  . Hyperlipidemia   . Hypertension   . Severe obesity (BMI >= 40) (HCC)    BMI 55   Review of Systems:   Constitutional: Negative for chills and fever.  Respiratory: Negative for shortness of breath.   Cardiovascular: Negative for chest pain and leg swelling.  Gastrointestinal: Negative for abdominal pain, nausea and vomiting.  Neurological: Negative for dizziness and headaches.    Physical Exam:  Vitals:   06/18/20 0949  BP: 131/79  Pulse: 67  Temp: 98.4 F (36.9 C)  TempSrc: Oral  SpO2: 100%  Weight: 299 lb 6.4 oz (135.8 kg)   Physical Exam Constitutional:      Appearance: Normal appearance.  Cardiovascular:     Rate and Rhythm: Normal rate and regular rhythm.     Pulses: Normal pulses.     Heart sounds: Normal heart sounds.  Pulmonary:     Effort: Pulmonary effort is normal.     Breath sounds: Normal breath sounds.  Abdominal:     General: Abdomen is flat. Bowel sounds are normal.     Palpations: Abdomen is soft.  Musculoskeletal:        General: Normal range of motion.  Skin:     General: Skin is warm and dry.     Capillary Refill: Capillary refill takes less than 2 seconds.  Neurological:     General: No focal deficit present.     Mental Status: She is alert and oriented to person, place, and time.  Psychiatric:        Mood and Affect: Mood normal.        Behavior: Behavior normal.      Assessment & Plan:   See Encounters Tab for problem based charting.  Patient discussed with Dr. Antony Contras

## 2020-06-18 ENCOUNTER — Ambulatory Visit (INDEPENDENT_AMBULATORY_CARE_PROVIDER_SITE_OTHER): Payer: No Typology Code available for payment source | Admitting: Internal Medicine

## 2020-06-18 ENCOUNTER — Other Ambulatory Visit: Payer: Self-pay

## 2020-06-18 ENCOUNTER — Encounter: Payer: Self-pay | Admitting: Internal Medicine

## 2020-06-18 VITALS — BP 131/79 | HR 67 | Temp 98.4°F | Wt 299.4 lb

## 2020-06-18 DIAGNOSIS — Z0000 Encounter for general adult medical examination without abnormal findings: Secondary | ICD-10-CM | POA: Diagnosis not present

## 2020-06-18 DIAGNOSIS — E785 Hyperlipidemia, unspecified: Secondary | ICD-10-CM | POA: Diagnosis not present

## 2020-06-18 DIAGNOSIS — N041 Nephrotic syndrome with focal and segmental glomerular lesions: Secondary | ICD-10-CM

## 2020-06-18 DIAGNOSIS — R109 Unspecified abdominal pain: Secondary | ICD-10-CM

## 2020-06-18 DIAGNOSIS — I10 Essential (primary) hypertension: Secondary | ICD-10-CM

## 2020-06-18 MED ORDER — HYDROCHLOROTHIAZIDE 25 MG PO TABS: 25.0000 mg | ORAL_TABLET | Freq: Every day | ORAL | 3 refills | Status: DC

## 2020-06-18 MED ORDER — ENALAPRIL MALEATE 20 MG PO TABS: 40.0000 mg | ORAL_TABLET | Freq: Every day | ORAL | 3 refills | Status: DC

## 2020-06-18 NOTE — Assessment & Plan Note (Signed)
We did discuss the Covid and flu vaccine, she reported that she did not want to get either of these. Provided education on both of these. She also reported that she did not want to get a Pap smear done today, reports that she is not sexually active, informed her that this is a screening test that we used to evaluate for cancer and that is beneficial even if you are not sexually active. She is not interested in getting this at this time. Informed her to contact us if she changes her mind.

## 2020-06-18 NOTE — Progress Notes (Signed)
Internal Medicine Clinic Attending ° °Case discussed with Dr. Krienke  At the time of the visit.  We reviewed the resident’s history and exam and pertinent patient test results.  I agree with the assessment, diagnosis, and plan of care documented in the resident’s note.  °

## 2020-06-18 NOTE — Patient Instructions (Addendum)
Ms. Deborah Gonzales,  It was a pleasure to see you today. Thank you for coming in.   Today we discussed your blood pressure. Your blood pressure looks good today. Please continue taking the enalapril and hydrochlorathiazide. I am checking some labs and will contact you if they are abnormal.  We also discussed the pap smear, please let us know if you change your mind about getting this done.     Please consider getting the COVID and influenza vaccine.    Please return to clinic in 12 months or sooner if needed.   Thank you again for coming in.   Claudean Severance.D.

## 2020-06-18 NOTE — Assessment & Plan Note (Signed)
Patient is currently on enalapril 40 mg daily and HCTZ 25 mg daily. She denies any issues taking his medications. She denies any side effects including headaches, lightheadedness, dizziness, fatigue, weakness, chest pain, shortness of breath, abdominal pain, or other symptoms. She does follow with nephrology in regards to the FSGS and should have an appointment within the next few months. Blood pressure today is well controlled at 131/79. We will check labs today.  -Continue enalapril 40 mg daily -Continue HCTZ 25 mg daily -BMP today

## 2020-06-19 LAB — BMP8+ANION GAP
Anion Gap: 15 mmol/L (ref 10.0–18.0)
BUN/Creatinine Ratio: 19 (ref 9–23)
BUN: 10 mg/dL (ref 6–24)
CO2: 24 mmol/L (ref 20–29)
Calcium: 9.2 mg/dL (ref 8.7–10.2)
Chloride: 98 mmol/L (ref 96–106)
Creatinine, Ser: 0.53 mg/dL — ABNORMAL LOW (ref 0.57–1.00)
GFR calc Af Amer: 136 mL/min/{1.73_m2} (ref >59)
GFR calc non Af Amer: 118 mL/min/{1.73_m2} (ref >59)
Glucose: 84 mg/dL (ref 65–99)
Potassium: 3.7 mmol/L (ref 3.5–5.2)
Sodium: 137 mmol/L (ref 134–144)

## 2020-07-18 ENCOUNTER — Encounter: Payer: Self-pay | Admitting: Internal Medicine

## 2020-07-26 ENCOUNTER — Telehealth: Payer: Self-pay | Admitting: Student

## 2020-07-26 NOTE — Telephone Encounter (Signed)
Pt wanted to let physician know that she tested positive for COVID [phone]

## 2020-10-21 ENCOUNTER — Encounter: Payer: Self-pay | Admitting: Internal Medicine

## 2020-11-03 ENCOUNTER — Encounter: Payer: No Typology Code available for payment source | Admitting: Student

## 2020-12-01 ENCOUNTER — Encounter: Payer: Self-pay | Admitting: Internal Medicine

## 2020-12-01 ENCOUNTER — Ambulatory Visit (INDEPENDENT_AMBULATORY_CARE_PROVIDER_SITE_OTHER): Payer: No Typology Code available for payment source | Admitting: Internal Medicine

## 2020-12-01 VITALS — BP 138/94 | HR 88 | Temp 98.7°F | Ht 66.0 in | Wt 306.4 lb

## 2020-12-01 DIAGNOSIS — N051 Unspecified nephritic syndrome with focal and segmental glomerular lesions: Secondary | ICD-10-CM | POA: Diagnosis not present

## 2020-12-01 DIAGNOSIS — Z0000 Encounter for general adult medical examination without abnormal findings: Secondary | ICD-10-CM

## 2020-12-01 DIAGNOSIS — I10 Essential (primary) hypertension: Secondary | ICD-10-CM

## 2020-12-01 NOTE — Progress Notes (Signed)
CC: HTN  HPI:  [name] is a 40 y.o. female with a past medical history stated below and presents today for HTN. Please see problem based assessment and plan for additional details.  Past Medical History:  Diagnosis Date  . 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 On appropriate therapy with ACE-i  . Hyperlipidemia   . Hypertension   . Severe obesity (BMI >= 40) (HCC)    BMI 55    Current Outpatient Medications on File Prior to Visit  Medication Sig Dispense Refill  . enalapril (VASOTEC) 20 MG tablet Take 2 tablets (40 mg total) by mouth daily. 180 tablet 3  . hydrochlorothiazide (HYDRODIURIL) 25 MG tablet Take 1 tablet (25 mg total) by mouth daily. 90 tablet 3   No current facility-administered medications on file prior to visit.    Family History  Problem Relation Age of Onset  . Hypertension Mother   . Hypertension Father   . Diabetes Father     Social History   Socioeconomic History  . Marital status: Single    Spouse name: Not on file  . Number of children: 0  . Years of education: college  . Highest education level: Not on file  Occupational History  . Occupation: Occupational psychologist    Comment: CNA/med tech  Tobacco Use  . Smoking status: Never Smoker  . Smokeless tobacco: Never Used  Substance and Sexual Activity  . Alcohol use: No    Alcohol/week: 0.0 standard drinks  . Drug use: No  . Sexual activity: Not on file  Other Topics Concern  . Not on file  Social History Narrative   Patient does not drink caffeine.   Patient is right handed.    Social Determinants of Health   Financial Resource Strain: Not on file  Food Insecurity: Not on file  Transportation Needs: Not on file  Physical Activity: Not on file  Stress: Not on file  Social Connections: Not on file  Intimate Partner Violence: Not on file    Review of Systems: ROS negative except for what is noted on the  assessment and plan.  Vitals:   12/01/20 1518  BP: (!) 138/94  Pulse: 88  Temp: 98.7 F (37.1 C)  TempSrc: Oral  SpO2: 100%  Weight: (!) 306 lb 6.4 oz (139 kg)  Height: 5\' 6"  (1.676 m)     Physical Exam: Physical Exam Constitutional:      Appearance: Normal appearance.  HENT:     Head: Normocephalic and atraumatic.  Eyes:     Extraocular Movements: Extraocular movements intact.  Cardiovascular:     Rate and Rhythm: Normal rate.     Pulses: Normal pulses.     Heart sounds: Normal heart sounds.  Pulmonary:     Effort: Pulmonary effort is normal.     Breath sounds: Normal breath sounds.  Abdominal:     General: Bowel sounds are normal.     Palpations: Abdomen is soft.     Tenderness: There is no abdominal tenderness.  Musculoskeletal:        General: Normal range of motion.     Cervical back: Normal range of motion.     Right lower leg: No edema.     Left lower leg: No edema.  Skin:    General: Skin is warm and dry.  Neurological:     Mental Status: She is alert and oriented to person, place, and time.  Mental status is at baseline.  Psychiatric:        Mood and Affect: Mood normal.      Assessment & Plan:   See Encounters Tab for problem based charting.  Patient discussed with Dr. Janeece Agee, D.O. [facility], PGY-2 Pager: [phone], Phone: [phone] Date 12/03/2020 Time 10:38 AM

## 2020-12-01 NOTE — Patient Instructions (Addendum)
Thank you, Deborah Gonzales for allowing us to provide your care today. Today we discussed blood pressure, .    I have ordered the following labs for you:   Lab Orders     BMP8+Anion Gap   Tests ordered today:  [facility] will call [facility] to make an appointment.   Referrals ordered today:   Referral Orders  No referral(s) requested today     I have ordered the following medication/changed the following medications:   Stop the following medications: There are no discontinued medications.   Start the following medications: No orders of the defined types were placed in this encounter.    Follow up: 3 months    Remember: Work on looseing 5-10 pounds, focus on eating whole foods, salt reduction and increased exercise. Please keep a blood pressure log 3 times weekly and bring it to your next appointment.   Should you have any questions or concerns please call the internal medicine clinic at [phone].     Dellia CloudBenjamin Kamara Allan, D.O. [facility]    DASH Eating Plan DASH stands for "Dietary Approaches to Stop Hypertension." The DASH eating plan is a healthy eating plan that has been shown to reduce high blood pressure (hypertension). It may also reduce your risk for type 2 diabetes, heart disease, and stroke. The DASH eating plan may also help with weight loss. What are tips for following this plan?  General guidelines  Avoid eating more than 2,300 mg (milligrams) of salt (sodium) a day. If you have hypertension, you may need to reduce your sodium intake to 1,500 mg a day.  Limit alcohol intake to no more than 1 drink a day for nonpregnant women and 2 drinks a day for men. One drink equals 12 oz of beer, 5 oz of wine, or 1 oz of hard liquor.  Work with your health care provider to maintain a healthy body weight or to lose weight. Ask what an ideal weight is for you.  Get at least 30 minutes of exercise that causes your heart to  beat faster (aerobic exercise) most days of the week. Activities may include walking, swimming, or biking.  Work with your health care provider or diet and nutrition specialist (dietitian) to adjust your eating plan to your individual calorie needs. Reading food labels   Check food labels for the amount of sodium per serving. Choose foods with less than 5 percent of the Daily Value of sodium. Generally, foods with less than 300 mg of sodium per serving fit into this eating plan.  To find whole grains, look for the word "whole" as the first word in the ingredient list. Shopping  Buy products labeled as "low-sodium" or "no salt added."  Buy fresh foods. Avoid canned foods and premade or frozen meals. Cooking  Avoid adding salt when cooking. Use salt-free seasonings or herbs instead of table salt or sea salt. Check with your health care provider or pharmacist before using salt substitutes.  Do not fry foods. Cook foods using healthy methods such as baking, boiling, grilling, and broiling instead.  Cook with heart-healthy oils, such as olive, canola, soybean, or sunflower oil. Meal planning 1. Eat a balanced diet that includes: ? 5 or more servings of fruits and vegetables each day. At each meal, try to fill half of your plate with fruits and vegetables. ? Up to 6-8 servings of whole grains each day. ? Less than 6 oz of lean meat, poultry, or fish  each day. A 3-oz serving of meat is about the same size as a deck of cards. One egg equals 1 oz. ? 2 servings of low-fat dairy each day. ? A serving of nuts, seeds, or beans 5 times each week. ? Heart-healthy fats. Healthy fats called Omega-3 fatty acids are found in foods such as flaxseeds and coldwater fish, like sardines, salmon, and mackerel. 2. Limit how much you eat of the following: ? Canned or prepackaged foods. ? Food that is high in trans fat, such as fried foods. ? Food that is high in saturated fat, such as fatty meat. ? Sweets,  desserts, sugary drinks, and other foods with added sugar. ? Full-fat dairy products. 3. Do not salt foods before eating. 4. Try to eat at least 2 vegetarian meals each week. 5. Eat more home-cooked food and less restaurant, buffet, and fast food. 6. When eating at a restaurant, ask that your food be prepared with less salt or no salt, if possible. What foods are recommended? The items listed may not be a complete list. Talk with your dietitian about what dietary choices are best for you. Grains Whole-grain or whole-wheat bread. Whole-grain or whole-wheat pasta. Brown rice. Orpah Cobb. Bulgur. Whole-grain and low-sodium cereals. Pita bread. Low-fat, low-sodium crackers. Whole-wheat flour tortillas. Vegetables Fresh or frozen vegetables (raw, steamed, roasted, or grilled). Low-sodium or reduced-sodium tomato and vegetable juice. Low-sodium or reduced-sodium tomato sauce and tomato paste. Low-sodium or reduced-sodium canned vegetables. Fruits All fresh, dried, or frozen fruit. Canned fruit in natural juice (without added sugar). Meat and other protein foods Skinless chicken or [place]. Ground chicken or [place]. Pork with fat trimmed off. Fish and seafood. Egg whites. Dried beans, peas, or lentils. Unsalted nuts, nut butters, and seeds. Unsalted canned beans. Lean cuts of beef with fat trimmed off. Low-sodium, lean deli meat. Dairy Low-fat (1%) or fat-free (skim) milk. Fat-free, low-fat, or reduced-fat cheeses. Nonfat, low-sodium ricotta or cottage cheese. Low-fat or nonfat yogurt. Low-fat, low-sodium cheese. Fats and oils Soft margarine without trans fats. Vegetable oil. Low-fat, reduced-fat, or light mayonnaise and salad dressings (reduced-sodium). Canola, safflower, olive, soybean, and sunflower oils. Avocado. Seasoning and other foods Herbs. Spices. Seasoning mixes without salt. Unsalted popcorn and pretzels. Fat-free sweets. What foods are not recommended? The items listed may not be a  complete list. Talk with your dietitian about what dietary choices are best for you. Grains Baked goods made with fat, such as croissants, muffins, or some breads. Dry pasta or rice meal packs. Vegetables Creamed or fried vegetables. Vegetables in a cheese sauce. Regular canned vegetables (not low-sodium or reduced-sodium). Regular canned tomato sauce and paste (not low-sodium or reduced-sodium). Regular tomato and vegetable juice (not low-sodium or reduced-sodium). Rosita Fire. Olives. Fruits Canned fruit in a light or heavy syrup. Fried fruit. Fruit in cream or butter sauce. Meat and other protein foods Fatty cuts of meat. Ribs. Fried meat. Tomasa Blase. Sausage. Bologna and other processed lunch meats. Salami. Fatback. Hotdogs. Bratwurst. Salted nuts and seeds. Canned beans with added salt. Canned or smoked fish. Whole eggs or egg yolks. Chicken or [place] with skin. Dairy Whole or 2% milk, cream, and half-and-half. Whole or full-fat cream cheese. Whole-fat or sweetened yogurt. Full-fat cheese. Nondairy creamers. Whipped toppings. Processed cheese and cheese spreads. Fats and oils Butter. Stick margarine. Lard. Shortening. Ghee. Bacon fat. Tropical oils, such as coconut, palm kernel, or palm oil. Seasoning and other foods Salted popcorn and pretzels. Onion salt, garlic salt, seasoned salt, table salt, and sea salt. Worcestershire  sauce. Tartar sauce. Barbecue sauce. Teriyaki sauce. Soy sauce, including reduced-sodium. Steak sauce. Canned and packaged gravies. Fish sauce. Oyster sauce. Cocktail sauce. Horseradish that you find on the shelf. Ketchup. Mustard. Meat flavorings and tenderizers. Bouillon cubes. Hot sauce and Tabasco sauce. Premade or packaged marinades. Premade or packaged taco seasonings. Relishes. Regular salad dressings. Where to find more information:  National Heart, Lung, and Blood Institute: PopSteam.is  American Heart Association: www.heart.org Summary  The DASH eating plan is a  healthy eating plan that has been shown to reduce high blood pressure (hypertension). It may also reduce your risk for type 2 diabetes, heart disease, and stroke.  With the DASH eating plan, you should limit salt (sodium) intake to 2,300 mg a day. If you have hypertension, you may need to reduce your sodium intake to 1,500 mg a day.  When on the DASH eating plan, aim to eat more fresh fruits and vegetables, whole grains, lean proteins, low-fat dairy, and heart-healthy fats.  Work with your health care provider or diet and nutrition specialist (dietitian) to adjust your eating plan to your individual calorie needs. This information is not intended to replace advice given to you by your health care provider. Make sure you discuss any questions you have with your health care provider. Document Revised: 10/12/2017 Document Reviewed: 10/23/2016 Elsevier Patient Education  2020 [employer].    Exercising to Stay Healthy To become healthy and stay healthy, it is recommended that you do moderate-intensity and vigorous-intensity exercise. You can tell that you are exercising at a moderate intensity if your heart starts beating faster and you start breathing faster but can still hold a conversation. You can tell that you are exercising at a vigorous intensity if you are breathing much harder and faster and cannot hold a conversation while exercising. Exercising regularly is important. It has many health benefits, such as:  Improving overall fitness, flexibility, and endurance.  Increasing bone density.  Helping with weight control.  Decreasing body fat.  Increasing muscle strength.  Reducing stress and tension.  Improving overall health. How often should I exercise? Choose an activity that you enjoy, and set realistic goals. Your health care provider can help you make an activity plan that works for you. Exercise regularly as told by your health care provider. This may include: 7. Doing  strength training two times a week, such as: ? Lifting weights. ? Using resistance bands. ? Push-ups. ? Sit-ups. ? Yoga. 8. Doing a certain intensity of exercise for a given amount of time. Choose from these options: ? A total of 150 minutes of moderate-intensity exercise every week. ? A total of 75 minutes of vigorous-intensity exercise every week. ? A mix of moderate-intensity and vigorous-intensity exercise every week. Children, pregnant women, people who have not exercised regularly, people who are overweight, and older adults may need to talk with a health care provider about what activities are safe to do. If you have a medical condition, be sure to talk with your health care provider before you start a new exercise program. What are some exercise ideas? Moderate-intensity exercise ideas include:  Walking 1 mile (1.6 km) in about 15 minutes.  Biking.  Hiking.  Golfing.  Dancing.  Water aerobics. Vigorous-intensity exercise ideas include:  Walking 4.5 miles (7.2 km) or more in about 1 hour.  Jogging or running 5 miles (8 km) in about 1 hour.  Biking 10 miles (16.1 km) or more in about 1 hour.  Lap swimming.  Roller-skating or in-line  skating.  Cross-country skiing.  Vigorous competitive sports, such as football, basketball, and soccer.  Jumping rope.  Aerobic dancing.   What are some everyday activities that can help me to get exercise? 1. Yard work, such as: ? Pushing a Surveyor, mining. ? Raking and bagging leaves. 2. Washing your car. 3. Pushing a stroller. 4. Shoveling snow. 5. Gardening. 6. Washing windows or floors. How can I be more active in my day-to-day activities?  Use stairs instead of an elevator.  Take a walk during your lunch break.  If you drive, park your car farther away from your work or school.  If you take public transportation, get off one stop early and walk the rest of the way.  Stand up or walk around during all of your indoor  phone calls.  Get up, stretch, and walk around every 30 minutes throughout the day.  Enjoy exercise with a friend. Support to continue exercising will help you keep a regular routine of activity. What guidelines can I follow while exercising?  Before you start a new exercise program, talk with your health care provider.  Do not exercise so much that you hurt yourself, feel dizzy, or get very short of breath.  Wear comfortable clothes and wear shoes with good support.  Drink plenty of water while you exercise to prevent dehydration or heat stroke.  Work out until your breathing and your heartbeat get faster. Where to find more information  U.S. Department of Health and Human Services: ThisPath.fi  Centers for Disease Control and Prevention (CDC): FootballExhibition.com.br Summary  Exercising regularly is important. It will improve your overall fitness, flexibility, and endurance.  Regular exercise also will improve your overall health. It can help you control your weight, reduce stress, and improve your bone density.  Do not exercise so much that you hurt yourself, feel dizzy, or get very short of breath.  Before you start a new exercise program, talk with your health care provider. This information is not intended to replace advice given to you by your health care provider. Make sure you discuss any questions you have with your health care provider. Document Revised: 10/12/2017 Document Reviewed: 09/20/2017 Elsevier Patient Education  2021 [employer].

## 2020-12-02 LAB — BMP8+ANION GAP
Anion Gap: 14 mmol/L (ref 10.0–18.0)
BUN/Creatinine Ratio: 30 — ABNORMAL HIGH (ref 9–23)
BUN: 17 mg/dL (ref 6–24)
CO2: 23 mmol/L (ref 20–29)
Calcium: 9 mg/dL (ref 8.7–10.2)
Chloride: 101 mmol/L (ref 96–106)
Creatinine, Ser: 0.57 mg/dL (ref 0.57–1.00)
GFR calc Af Amer: 133 mL/min/{1.73_m2} (ref >59)
GFR calc non Af Amer: 116 mL/min/{1.73_m2} (ref >59)
Glucose: 86 mg/dL (ref 65–99)
Potassium: 3.9 mmol/L (ref 3.5–5.2)
Sodium: 138 mmol/L (ref 134–144)

## 2020-12-03 ENCOUNTER — Encounter: Payer: Self-pay | Admitting: Internal Medicine

## 2020-12-03 NOTE — Assessment & Plan Note (Signed)
I will put in a referral for mammogram at the breast center today.  Discussed the importance of flu and COVID-19 shots.  She states that she will think about it.

## 2020-12-03 NOTE — Assessment & Plan Note (Addendum)
Patient presents for reevaluation of her blood pressure.  Her blood pressure today is 138/94 on hydrochlorothiazide 25 mg and enalapril 20 mg and tolerating it well without any side effects.  Patient states that she exercises with light stretches and periodic walks particularly at work.  She states that supplement processed foods and fast food in her diet but does admit to occasional late-night eating.  I counseled her regarding her cardiovascular risk factors particularly hypertension and obesity.  I discussed starting amlodipine 5 mg today.  She elected to try lifestyle modifications including losing 5 to 10 pounds in the next 3 months to see if her blood pressure will come down on its own.  We will reevaluate her blood pressure in 3 months and start amlodipine if it is still elevated.  I also discussed keeping a blood pressure log 3 times weekly so that we can better assess her change in pressure.  Additionally, I gave the patient handouts for the DASH diet and healthy exercise regimens.

## 2020-12-03 NOTE — Assessment & Plan Note (Signed)
I counseled patient on her obesity and her risk of cardiovascular disease and diabetes.  I offered her GLP-1 agonist medications to help with her weight loss which she declined at today's visit.  We discussed the importance of lifestyle changes as a means of weight loss and protecting yourself from diabetes and cardiovascular disease.  We made a goal of losing 5 to 10 pounds in the next 3 months prior to her follow-up appointment.

## 2020-12-07 NOTE — Progress Notes (Signed)
Internal Medicine Clinic Attending  Case discussed with Dr. Coe  At the time of the visit.  We reviewed the resident's history and exam and pertinent patient test results.  I agree with the assessment, diagnosis, and plan of care documented in the resident's note.  

## 2021-02-22 ENCOUNTER — Encounter: Payer: Self-pay | Admitting: Internal Medicine

## 2021-04-17 ENCOUNTER — Encounter: Payer: Self-pay | Admitting: *Deleted

## 2021-05-17 ENCOUNTER — Encounter: Payer: Self-pay | Admitting: *Deleted

## 2021-09-06 ENCOUNTER — Encounter: Payer: Self-pay | Admitting: Physician Assistant

## 2021-09-06 ENCOUNTER — Telehealth: Payer: No Typology Code available for payment source | Admitting: Physician Assistant

## 2021-09-06 DIAGNOSIS — J040 Acute laryngitis: Secondary | ICD-10-CM | POA: Diagnosis not present

## 2021-09-06 MED ORDER — METHYLPREDNISOLONE 4 MG PO TBPK: ORAL_TABLET | ORAL | 0 refills | Status: DC

## 2021-09-06 NOTE — Progress Notes (Signed)
Virtual Visit Consent   Deborah Gonzales, you are scheduled for a virtual visit with a [facility] provider today.     Just as with appointments in the office, your consent must be obtained to participate.  Your consent will be active for this visit and any virtual visit you may have with one of our providers in the next 365 days.     If you have a MyChart account, a copy of this consent can be sent to you electronically.  All virtual visits are billed to your insurance company just like a traditional visit in the office.    As this is a virtual visit, video technology does not allow for your provider to perform a traditional examination.  This may limit your provider's ability to fully assess your condition.  If your provider identifies any concerns that need to be evaluated in person or the need to arrange testing (such as labs, EKG, etc.), we will make arrangements to do so.     Although advances in technology are sophisticated, we cannot ensure that it will always work on either your end or our end.  If the connection with a video visit is poor, the visit may have to be switched to a telephone visit.  With either a video or telephone visit, we are not always able to ensure that we have a secure connection.     I need to obtain your verbal consent now.   Are you willing to proceed with your visit today?    [name] has provided verbal consent on 09/06/2021 for a virtual visit (video or telephone).   Deborah Gonzales, [place]   Date: 09/06/2021 7:39 PM   Virtual Visit via Video Note   I, Deborah Climes, PA-C, attempted to connect with LAURENE Gonzales; MRN [id number] on 09/06/21 via [employer] to complete a video urgent care visit. The patient was unable to successfully connect to the video platform. As such, the patient was contacted by this provider via phone to complete the encounter.   Location: Patient: Virtual Visit Location Patient: Home Provider: Virtual  Visit Location Provider: Home Office   I discussed the limitations of evaluation and management by telemedicine and the availability of in person appointments. The patient expressed understanding and agreed to proceed.    History of Present Illness: Deborah Gonzales is a 40 y.o. who identifies as a female who was assigned female at birth, and is being seen today for sore throat and laryngitis. Notes sore throat starting last Monday and associated with some other mild URI symptoms. This was followed by voice hoarseness which has persisted despite all other symptoms resolving. Denies any fever, chills, aches. Is trying to rest her voice.   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   Hyperlipidemia 10/25/2006   Severe obesity (BMI >= 40) (HCC) 10/25/2006   Essential hypertension 10/25/2006    Allergies: No Known Allergies Medications:  Current Outpatient Medications:    methylPREDNISolone (MEDROL DOSEPAK) 4 MG TBPK tablet, Take following package directions., Disp: 1 tablet, Rfl: 0   enalapril (VASOTEC) 20 MG tablet, Take 2 tablets (40 mg total) by mouth daily., Disp:  180 tablet, Rfl: 3   hydrochlorothiazide (HYDRODIURIL) 25 MG tablet, Take 1 tablet (25 mg total) by mouth daily., Disp: 90 tablet, Rfl: 3  Observations/Objective: Patient is well-developed, well-nourished in no acute distress.  No labored breathing. Speech is clear and coherent with logical content.  Significantly hoarse voice. Patient is alert and oriented at baseline.   Assessment and Plan: 1. Acute viral laryngitis - methylPREDNISolone (MEDROL DOSEPAK) 4 MG TBPK tablet; Take following package  directions.  Dispense: 1 tablet; Refill: 0 Giving duration of hoarseness in the absence of other prior symptoms, will have her continue voice rest, increase hydration, place humidifier in bedroom to rune at night, and will have her start Medrol dose pack. Strict in-person follow-up for any non resolving symptoms discussed.   Follow Up Instructions: I discussed the assessment and treatment plan with the patient. The patient was provided an opportunity to ask questions and all were answered. The patient agreed with the plan and demonstrated an understanding of the instructions.  A copy of instructions were sent to the patient via MyChart unless otherwise noted below.   The patient was advised to call back or seek an in-person evaluation if the symptoms worsen or if the condition fails to improve as anticipated.  Time:  I spent 15 minutes with the patient via telehealth technology discussing the above problems/concerns.    Deborah Climes, PA-C

## 2021-09-06 NOTE — Patient Instructions (Signed)
  Deborah Gonzales, thank you for joining [name], PA-C for today's virtual visit.  While this provider is not your primary care provider (PCP), if your PCP is located in our provider database this encounter information will be shared with them immediately following your visit.  Consent: (Patient) Deborah Gonzales provided verbal consent for this virtual visit at the beginning of the encounter.  Current Medications:  Current Outpatient Medications:    enalapril (VASOTEC) 20 MG tablet, Take 2 tablets (40 mg total) by mouth daily., Disp: 180 tablet, Rfl: 3   hydrochlorothiazide (HYDRODIURIL) 25 MG tablet, Take 1 tablet (25 mg total) by mouth daily., Disp: 90 tablet, Rfl: 3   Medications ordered in this encounter:  No orders of the defined types were placed in this encounter.    *If you need refills on other medications prior to your next appointment, please contact your pharmacy*  Follow-Up: Call back or seek an in-person evaluation if the symptoms worsen or if the condition fails to improve as anticipated.  Other Instructions Please keep well-hydrated Continue to rest your voice. If you have a humidifier, run it in the bedroom at night while sleeping. Take the steroid taper as directed.  If symptoms are not resolving or if you note any new or worsening symptoms, we want you to be evaluated in person.  You can use the message sent earlier during our visit to send me additional questions if you have any later.   Take care and feel better!   If you have been instructed to have an in-person evaluation today at a local Urgent Care facility, please use the link below. It will take you to a list of all of our available [facility], including address, phone number and hours of operation. Please do not delay care.  [facility]  If you or a family member do not have a primary care provider, use the link below to schedule a visit and establish care.  When you choose a [facility] primary care physician or advanced practice provider, you gain a long-term partner in health. Find a Primary Care Provider  Learn more about [facility] in-office and virtual care options: [facility] - Get Care Now

## 2021-09-22 ENCOUNTER — Telehealth: Payer: Self-pay | Admitting: Internal Medicine

## 2021-09-22 DIAGNOSIS — I10 Essential (primary) hypertension: Secondary | ICD-10-CM

## 2021-09-22 NOTE — Telephone Encounter (Signed)
Will send in 30 day supply of medication.  I tried to call patient to see if she would be willing to make an appointment with [facility]. Unable to reach patient at that time, but left voicemail for her to call clinic to schedule appointment. Please have patient follow-up in month if she still wants to come to [facility].

## 2021-09-22 NOTE — Telephone Encounter (Signed)
Patient sent message via my chart to get in contact with our office to schedule an appointment.

## 2021-10-18 ENCOUNTER — Other Ambulatory Visit: Payer: Self-pay

## 2021-10-18 ENCOUNTER — Ambulatory Visit (INDEPENDENT_AMBULATORY_CARE_PROVIDER_SITE_OTHER): Payer: No Typology Code available for payment source | Admitting: Internal Medicine

## 2021-10-18 ENCOUNTER — Encounter: Payer: Self-pay | Admitting: Internal Medicine

## 2021-10-18 VITALS — BP 130/86 | HR 98 | Temp 98.1°F | Ht 66.0 in | Wt 318.8 lb

## 2021-10-18 DIAGNOSIS — Z0000 Encounter for general adult medical examination without abnormal findings: Secondary | ICD-10-CM

## 2021-10-18 DIAGNOSIS — D649 Anemia, unspecified: Secondary | ICD-10-CM | POA: Diagnosis not present

## 2021-10-18 DIAGNOSIS — I10 Essential (primary) hypertension: Secondary | ICD-10-CM | POA: Diagnosis not present

## 2021-10-18 DIAGNOSIS — T148XXA Other injury of unspecified body region, initial encounter: Secondary | ICD-10-CM | POA: Diagnosis not present

## 2021-10-18 DIAGNOSIS — E785 Hyperlipidemia, unspecified: Secondary | ICD-10-CM | POA: Diagnosis not present

## 2021-10-18 NOTE — Patient Instructions (Addendum)
Deborah Gonzales, it was a pleasure seeing you today! You endorsed feeling well today. Below are some of the things we talked about this visit. We look forward to seeing you in the follow up appointment!  Today we discussed: You presented to the clinic for a follow up. You denied any acute complaints. Your blood pressure was slightly elevated but you stated you are only taking the blood pressure medications every other day as they are causing you to bruise. I advise you to keep a log of blood pressure and report them to me on the follow up visit. Continue following with the kidney specialist. We will repeat lab work today to check your A1c, blood levels, and kidney functions and will call you regarding the results.   I have ordered the following labs today:  Lab Orders         CMP14 + Anion Gap         CBC with Diff         Iron, TIBC and Ferritin Panel         Lipid Profile         Hemoglobin A1c       Referrals ordered today:   Referral Orders  No referral(s) requested today     I have ordered the following medication/changed the following medications:   Stop the following medications: There are no discontinued medications.   Start the following medications: No orders of the defined types were placed in this encounter.    Follow-up: 2 week telephone visit with Dr. Welton Flakes for BP adjustments  Please make sure to arrive 15 minutes prior to your next appointment. If you arrive late, you may be asked to reschedule.   We look forward to seeing you next time. Please call our clinic at [phone] if you have any questions or concerns. The best time to call is Monday-Friday from 9am-4pm, but there is someone available 24/7. If after hours or the weekend, call the main hospital number and ask for the Internal Medicine Resident On-Call. If you need medication refills, please notify your pharmacy one week in advance and they will send [place] a request.  Thank you for letting us take part  in your care. Wishing you the best!  Thank you, Gwenevere Abbot, MD

## 2021-10-19 LAB — HEMOGLOBIN A1C
Est. average glucose Bld gHb Est-mCnc: 111 mg/dL
Hgb A1c MFr Bld: 5.5 % (ref 4.8–5.6)

## 2021-10-19 LAB — CBC WITH DIFFERENTIAL/PLATELET
Basophils Absolute: 0 10*3/uL (ref 0.0–0.2)
Basos: 1 %
EOS (ABSOLUTE): 0.1 10*3/uL (ref 0.0–0.4)
Eos: 3 %
Hematocrit: 37 % (ref 34.0–46.6)
Hemoglobin: 12.1 g/dL (ref 11.1–15.9)
Immature Grans (Abs): 0 10*3/uL (ref 0.0–0.1)
Immature Granulocytes: 0 %
Lymphocytes Absolute: 1.2 10*3/uL (ref 0.7–3.1)
Lymphs: 26 %
MCH: 27 pg (ref 26.6–33.0)
MCHC: 32.7 g/dL (ref 31.5–35.7)
MCV: 83 fL (ref 79–97)
Monocytes Absolute: 0.4 10*3/uL (ref 0.1–0.9)
Monocytes: 9 %
Neutrophils Absolute: 2.8 10*3/uL (ref 1.4–7.0)
Neutrophils: 61 %
Platelets: 448 10*3/uL (ref 150–450)
RBC: 4.48 x10E6/uL (ref 3.77–5.28)
RDW: 13.3 % (ref 11.7–15.4)
WBC: 4.7 10*3/uL (ref 3.4–10.8)

## 2021-10-19 LAB — CMP14 + ANION GAP
ALT: 15 IU/L (ref 0–32)
AST: 20 IU/L (ref 0–40)
Albumin/Globulin Ratio: 1.5 (ref 1.2–2.2)
Albumin: 4.1 g/dL (ref 3.8–4.8)
Alkaline Phosphatase: 92 IU/L (ref 44–121)
Anion Gap: 11 mmol/L (ref 10.0–18.0)
BUN/Creatinine Ratio: 24 — ABNORMAL HIGH (ref 9–23)
BUN: 12 mg/dL (ref 6–24)
Bilirubin Total: 0.4 mg/dL (ref 0.0–1.2)
CO2: 24 mmol/L (ref 20–29)
Calcium: 9.1 mg/dL (ref 8.7–10.2)
Chloride: 104 mmol/L (ref 96–106)
Creatinine, Ser: 0.5 mg/dL — ABNORMAL LOW (ref 0.57–1.00)
Globulin, Total: 2.8 g/dL (ref 1.5–4.5)
Glucose: 85 mg/dL (ref 70–99)
Potassium: 4.3 mmol/L (ref 3.5–5.2)
Sodium: 139 mmol/L (ref 134–144)
Total Protein: 6.9 g/dL (ref 6.0–8.5)
eGFR: 120 mL/min/{1.73_m2} (ref >59)

## 2021-10-19 LAB — IRON,TIBC AND FERRITIN PANEL
Ferritin: 12 ng/mL — ABNORMAL LOW (ref 15–150)
Iron Saturation: 10 % — ABNORMAL LOW (ref 15–55)
Iron: 35 ug/dL (ref 27–159)
Total Iron Binding Capacity: 336 ug/dL (ref 250–450)
UIBC: 301 ug/dL (ref 131–425)

## 2021-10-19 LAB — LIPID PANEL
Chol/HDL Ratio: 3.6 ratio (ref 0.0–4.4)
Cholesterol, Total: 208 mg/dL — ABNORMAL HIGH (ref 100–199)
HDL: 58 mg/dL (ref >39)
LDL Chol Calc (NIH): 138 mg/dL — ABNORMAL HIGH (ref 0–99)
Triglycerides: 66 mg/dL (ref 0–149)
VLDL Cholesterol Cal: 12 mg/dL (ref 5–40)

## 2021-10-20 DIAGNOSIS — D509 Iron deficiency anemia, unspecified: Secondary | ICD-10-CM | POA: Insufficient documentation

## 2021-10-20 NOTE — Assessment & Plan Note (Addendum)
Assessment/Plan Patient has history of hyperlipidemia and her last lipid panel was in 09/30/2019 showed a cholesterol of 165 and LDL of 104.  Patient insists she would like to try lifestyle modifications to reduce her cholesterol and is resistant to any more medications. - Repeat lipid panel --Repeat A1c  Addendum: Repeat lipid panel shown below: Lipid Panel     Component Value Date/Time   CHOL 208 (H) 10/18/2021 1036   TRIG 66 10/18/2021 1036   HDL 58 10/18/2021 1036   CHOLHDL 3.6 10/18/2021 1036   CHOLHDL 4.0 07/02/2012 1430   VLDL 12 07/02/2012 1430   LDLCALC 138 (H) 10/18/2021 1036   LABVLDL 12 10/18/2021 1036   Her ASCVD Risk was calculated and determined to be 2.6. Since 10 year risk is lower than <5: no statin is recommended.  Her A1c was 5.5.  Both of these results were communicated to the patient and she said she would continue to work on lifestyle modifications.

## 2021-10-20 NOTE — Assessment & Plan Note (Signed)
Assessment/Plan: Patient is obese with a BMI of 51.  Her weight at the nephrology office was 313 and today in the clinic was 318 suggesting continued weight gain.  When discussing options for weight loss she stated she would like to focus on lifestyle modifications.  She endorsed walking and eating only 2 meals a day.  She appears persistent to the idea of adding any medication to her regimen. - Check A1c - Continue to monitor her weight

## 2021-10-20 NOTE — Assessment & Plan Note (Signed)
Assessment/plan: Patient declined vaccinations and Pap smear but stated she would like to get the mammogram.  Her reason for declining the Pap smear was that she has never been sexually active. -Continue to offer her vaccines at subsequent visits

## 2021-10-20 NOTE — Progress Notes (Signed)
Internal Medicine Clinic Attending  I saw and evaluated the patient.  I personally confirmed the key portions of the history and exam documented by Dr. Khan and I reviewed pertinent patient test results.  The assessment, diagnosis, and plan were formulated together and I agree with the documentation in the resident's note.  

## 2021-10-20 NOTE — Assessment & Plan Note (Signed)
Assessment/Plan: Patient has a history of hypertension and currently takes enalapril 40 mg and hydrochlorothiazide 25 mg daily.  She states he has been taking this medicine every other day as she has been noticing some bruising.  The bruising has been present since 1 year ago and she has noticed it about 5 times. She believed it was from taking this medicine.  Her initial blood pressure was 140/97 but repeat was 130/86 which was near goal.  Given her scheduling of the medication I advised her to keep a log of her blood pressures and schedule a telephone visit in 1 week so we can make any adjustments to the blood pressure medications.  She followed up with nephrology in October of this year but she did not mention the scheduling of her antihypertensives to them.  Her lab work was reviewed from nephrology office which suggested some electrolyte abnormalities.  Her urine study was performed there which is shown below. UA 08/2021 Protein:Creatinine ratio: 589 mg/g creatinine Total Protein: 85.5 mg/dL Urine Creatinine: 932.6 mg/dL  -Repeat CMP -Follow-up telehealth visit in 1 week -Advised continued follow up with nephrology

## 2021-10-20 NOTE — Progress Notes (Signed)
   CC: follow up  HPI:  Deborah Gonzales is a 40 y.o. with medical history as below presenting to [facility] for follow up.  Please see problem-based list for further details, assessments, and plans.  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 On appropriate therapy with ACE-i   Hyperlipidemia    Hypertension    Left ankle pain 06/26/2016   Pain in both upper arms 09/05/2016   Severe obesity (BMI >= 40) (HCC)    BMI 55   Venous insufficiency of both lower extremities 05/21/2017   Review of Systems:  Review of system negative unless stated in the problem list or HPI.    Physical Exam:  Vitals:   10/18/21 0911 10/18/21 0920  BP: (!) 140/97 130/86  Pulse: 99 98  Temp: 98.1 F (36.7 C)   TempSrc: Oral   SpO2: 100%   Weight: (!) 318 lb 12.8 oz (144.6 kg)   Height: 5\' 6"  (1.676 m)     Physical Exam General: Well-developed, well-nourished, appearing stated age. Head: Normocephalic without scalp lesions.  Eyes: Conjunctivae pink, sclerae white, without icterus.  Mouth and Throat: Lips normal color, without lesions. Moist mucus membrane. Neck: Neck supple with full range of motion (ROM).  Lungs: CTAB, no wheeze, rhonchi or rales.  Cardiovascular: Normal heart sounds, no r/m/g, 2+ radial pulses Abdomen: No TTP, normal bowel sounds MSK: No asymmetry or muscle atrophy.   Skin: warm, dry good skin turgor, no lesions Neuro: Alert and oriented. CN grossly intact. Tremor noted in the right hand. Psych: Normal mood and normal affect   Assessment & Plan:   See Encounters Tab for problem based charting.  Patient seen with Dr. , MD

## 2021-10-20 NOTE — Assessment & Plan Note (Signed)
Assessment/Plan: Patient has history of iron deficiency and hx of anemia. Her last iron level was 33 in 07/2020 and Ferritin was 30 at that time. Would like to check her iron level again this visit. -Repeat Iron panel -Continue to monitor  Addendum: Iron/TIBC/Ferritin/ %Sat    Component Value Date/Time   IRON 35 10/18/2021 1036   TIBC 336 10/18/2021 1036   FERRITIN 12 (L) 10/18/2021 1036   IRONPCTSAT 10 (L) 10/18/2021 1036   IRONPCTSAT 8 (L) 11/26/2015 0913   Called patient to inform her of the results.  I advised the patient that her iron stores were low and she was at risk for developing iron deficiency anemia and she may benefit from iron supplement every other day.  She stated she would like to focus on the diet and increase iron rich foods in her diet.

## 2021-10-24 ENCOUNTER — Other Ambulatory Visit: Payer: Self-pay | Admitting: Internal Medicine

## 2021-10-24 DIAGNOSIS — I10 Essential (primary) hypertension: Secondary | ICD-10-CM

## 2022-01-02 ENCOUNTER — Other Ambulatory Visit: Payer: Self-pay | Admitting: Internal Medicine

## 2022-01-02 DIAGNOSIS — Z1231 Encounter for screening mammogram for malignant neoplasm of breast: Secondary | ICD-10-CM

## 2022-02-01 ENCOUNTER — Ambulatory Visit
Admission: RE | Admit: 2022-02-01 | Discharge: 2022-02-01 | Disposition: A | Discharge status: Home / Self Care | Payer: No Typology Code available for payment source | Source: Ambulatory Visit | Attending: Internal Medicine | Admitting: Internal Medicine

## 2022-02-01 DIAGNOSIS — Z1231 Encounter for screening mammogram for malignant neoplasm of breast: Secondary | ICD-10-CM

## 2022-03-27 ENCOUNTER — Encounter (HOSPITAL_COMMUNITY): Payer: Self-pay

## 2022-03-27 ENCOUNTER — Ambulatory Visit (HOSPITAL_COMMUNITY)
Admission: RE | Admit: 2022-03-27 | Discharge: 2022-03-27 | Disposition: A | Discharge status: Home / Self Care | Payer: No Typology Code available for payment source | Source: Ambulatory Visit | Attending: Sports Medicine | Admitting: Sports Medicine

## 2022-03-27 ENCOUNTER — Ambulatory Visit (HOSPITAL_COMMUNITY): Payer: No Typology Code available for payment source

## 2022-03-27 VITALS — BP 139/92 | HR 93 | Temp 98.2°F | Resp 18

## 2022-03-27 DIAGNOSIS — M7632 Iliotibial band syndrome, left leg: Secondary | ICD-10-CM

## 2022-03-27 DIAGNOSIS — M25552 Pain in left hip: Secondary | ICD-10-CM

## 2022-03-27 NOTE — ED Triage Notes (Signed)
Pt is present today with left hip pain that radiates down the thigh. Pt states that she noticed it x2 months ago  ?

## 2022-03-27 NOTE — Discharge Instructions (Addendum)
Ice or heat the painful area ? ?Perform stretches and rehab exercises shown ? ?If pain bad enough, we can always inject that area. No NSAIDs for you. May take tylenol or topical voltaren gel. ? ?F/u with orthopedics if not improving after few weeks of therapy ?

## 2022-03-27 NOTE — ED Provider Notes (Signed)
?[facility] ? ? ? ?CSN: [id number] ?Arrival date & time: 03/27/22  1143 ? ? ?  ? ?History   ?Chief Complaint ?Chief Complaint  ?Patient presents with  ? Hip Pain  ? ? ?HPI ?ALLISYN KUNZ is a 41 y.o. female left lateral leg and hip pain x 4 weeks or so. ? ? ?Hip Pain ? ? ?Patient states that over the last 2 months or so she has been getting back into working out and doing home exercises.  She has been doing a mix of squats, leg lift and upper body exercises.  A few weeks ago she noticed some pain over the lateral hip that radiated down the left hip to the proximal left lateral knee.  She denied any pop or specific injury.  She would note that with squats and leg lifts this would exacerbate her symptoms to some extent but she continue through it.  Given that she has now had about 4 weeks or so of pain she decided to present for evaluation.  She denies any redness, swelling or skin changes.  The pain is a sharp pain at times with activity.  She denies any numbness or tingling.  No lower extremity weakness.  She has been taking Tylenol arthritis which has not helped much.  She has backed off on her exercises and her overall pain has improved but she still has some pain over the lateral hip and leg.  Topical Voltaren gel does work for the area around her knee. ? ?Patient does has a history of focal segmental glomerulosclerosis and so is unable to take NSAIDs. ? ?Past Medical History:  ?Diagnosis Date  ? FSGS (focal segmental glomerulosclerosis)   ? 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 On appropriate therapy with ACE-i  ? Hyperlipidemia   ? Hypertension   ? Left ankle pain 06/26/2016  ? Pain in both upper arms 09/05/2016  ? Severe obesity (BMI >= 40) (HCC)   ? BMI 55  ? Venous insufficiency of both lower extremities 05/21/2017  ? ? ?Patient Active Problem List  ? Diagnosis Date Noted  ? Iron deficiency anemia 10/20/2021  ? Tremor 12/20/2015  ? Numbness and tingling in both hands 05/28/2015   ? Health care maintenance 12/23/2013  ? 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 Hyperlipidemia 10/25/2006  ? Severe obesity (BMI >= 40) (HCC) 10/25/2006  ? Essential hypertension 10/25/2006  ? ? ?Past Surgical History:  ?Procedure Laterality Date  ? RENAL BIOPSY  1999  ? ? ?OB History   ?No obstetric history on file. ?  ? ? ? ?Home Medications   ? ?Prior to Admission medications   ?Medication Sig Start Date End Date Taking? Authorizing Provider  ?enalapril (VASOTEC) 20 MG tablet Take 2 tablets by mouth once daily 10/24/21   Masters, Florentina Addison, DO  ?hydrochlorothiazide (HYDRODIURIL) 25 MG tablet Take 1 tablet by mouth once daily 10/24/21   Masters, Florentina Addison, DO  ?methylPREDNISolone (MEDROL DOSEPAK) 4 MG TBPK tablet Take following package directions. 09/06/21   Waldon Merl, PA-C  ? ? ?Family History ?Family History  ?Problem Relation Age of Onset  ? Hypertension Mother   ? Hypertension Father   ? Diabetes Father   ? ? ?Social History ?Social History  ? ?Tobacco Use  ? Smoking status: Never  ? Smokeless tobacco: Never  ?Substance Use Topics  ? Alcohol use: No  ?  Alcohol/week: 0.0 standard drinks  ? Drug use: No  ? ? ? ?Allergies   ?  Patient has no known allergies. ? ? ?Review of Systems ?Review of Systems  ?Constitutional:  Negative for activity change, chills and fever.  ?Musculoskeletal:  Positive for arthralgias (Left hip and lateral leg pain, left). Negative for back pain and joint swelling.  ?Skin:  Negative for color change, rash and wound.  ? ? ?Physical Exam ?Triage Vital Signs ?ED Triage Vitals  ?Enc Vitals Group  ?   BP 03/27/22 1231 (!) 139/92  ?   Pulse Rate 03/27/22 1231 93  ?   Resp 03/27/22 1231 18  ?   Temp 03/27/22 1231 98.2 ?F (36.8 ?C)  ?   Temp src --   ?   SpO2 03/27/22 1231 98 %  ?   Weight --   ?   Height --   ?   Head Circumference --   ?   Peak Flow --   ?   Pain Score 03/27/22 1230 8  ?   Pain Loc --   ?   Pain Edu? --   ?   Excl. in GC? --   ? ?No data found. ? ?Updated Vital  Signs ?BP (!) 139/92   Pulse 93   Temp 98.2 ?F (36.8 ?C)   Resp 18   SpO2 98%  ? ?Physical Exam ?Gen: Well-appearing, in no acute distress; non-toxic ?CV: Regular Rate. Well-perfused. Warm.  ?Resp: Breathing unlabored on room air; no wheezing. ?Psych: Fluid speech in conversation; appropriate affect; normal thought process ?Neuro: Sensation intact throughout. No gross coordination deficits.  ?MSK:  ?- Left hip/leg: Mild TTP over the lateral greater trochanter and over Gertie's and tubercle of the lateral knee.  Inspection yields no obvious erythema, ecchymosis or swelling.  Passive logroll equivalent bilaterally without restriction.  Range of motion equivalent from bilateral hips.  There is some pain with resisted hip abduction, otherwise 5/5 strength in all directions. + Positive Ober test, negative Noble's compression test.  Mild pain with FABER testing, negative FADIR.  Neurovascular intact distally.  There is tenderness to palpation while palpating throughout the entirety of the IT band from proximal to distal insertion. ? ? ?UC Treatments / Results  ?Labs ?(all labs ordered are listed, but only abnormal results are displayed) ?Labs Reviewed - No data to display ? ?EKG ? ? ?Radiology ?No results found. ? ?Procedures ?Procedures (including critical care time) ? ?Medications Ordered in UC ?Medications - No data to display ? ?Initial Impression / Assessment and Plan / UC Course  ?I have reviewed the triage vital signs and the nursing notes. ? ?Pertinent labs & imaging results that were available during my care of the patient were reviewed by me and considered in my medical decision making (see chart for details). ? ?  ? ?Patient presents with left lateral hip and leg pain with an irritable IT band.  It is possible she may have a small component of greater trochanteric pain syndrome as well.  This developed after home exercise regimen.  Her pain has gotten better although has not subsided with rest and ice.  Did  discuss appropriate activity modification.  Did print out for the patient for her to perform IT band stretches and rehab exercises twice daily over the next few weeks.  She may ice or heat the painful area.  She may take Tylenol or use topical Voltaren gel near the insertion of the IT bands, although avoid NSAIDs given her kidney history.  Would expect this to get better after weeks to months of therapy.  If she is not getting better she may follow-up with orthopedics, local orthopedic office provided here in the chart.  I did discuss with her we may always consider a greater trochanteric cortisone injection, however she would like to hold off on this for now which I feel is very appropriate.  ? ?Final Clinical Impressions(s) / UC Diagnoses  ? ?Final diagnoses:  ?Left hip pain  ?It band syndrome, left  ? ? ? ?Discharge Instructions   ? ?  ?Ice or heat the painful area ? ?Perform stretches and rehab exercises shown ? ?If pain bad enough, we can always inject that area. No NSAIDs for you. May take tylenol or topical voltaren gel. ? ?F/u with orthopedics if not improving after few weeks of therapy ? ? ? ? ?ED Prescriptions   ?None ?  ? ?PDMP not reviewed this encounter. ?  Madelyn Brunner?Dayra Rapley, DO ?03/27/22 1319 ? ?

## 2022-04-03 ENCOUNTER — Encounter (HOSPITAL_COMMUNITY): Payer: Self-pay

## 2022-04-03 ENCOUNTER — Ambulatory Visit (HOSPITAL_COMMUNITY)
Admission: RE | Admit: 2022-04-03 | Discharge: 2022-04-03 | Disposition: A | Discharge status: Home / Self Care | Payer: PRIVATE HEALTH INSURANCE | Source: Ambulatory Visit | Attending: Internal Medicine | Admitting: Internal Medicine

## 2022-04-03 ENCOUNTER — Other Ambulatory Visit: Payer: Self-pay

## 2022-04-03 VITALS — BP 115/68 | HR 89 | Temp 99.3°F | Resp 18

## 2022-04-03 DIAGNOSIS — M25552 Pain in left hip: Secondary | ICD-10-CM

## 2022-04-03 MED ORDER — ACETAMINOPHEN 500 MG PO TABS: 1000.0000 mg | ORAL_TABLET | Freq: Four times a day (QID) | ORAL | 0 refills | Status: AC | PRN

## 2022-04-03 NOTE — ED Triage Notes (Signed)
Pt reports Lt hip pain that has not improved from last visit on 03-27-22

## 2022-04-03 NOTE — ED Provider Notes (Addendum)
[facility]    CSN: [id number] Arrival date & time: 04/03/22  0859      History   Chief Complaint Chief Complaint  Patient presents with   Leg Pain    Entered by patient    HPI Deborah Gonzales is a 41 y.o. female.   Patient presents to urgent care for evaluation of her left hip pain that radiates to her left posterior knee that she has had for the last 4-5 weeks. She was seen here by Dr. Shon Baton on 03/27/22 and diagnosed with IT band irritation and given sports medicine follow-up instructions. Patient's insurance does not cover recommended [facility] follow-up and was not sure who else to call. She has been performing her IT band exercises as recommended by Dr. Shon Baton 7 days ago and has been applying Aspercreme for arthritis pain to areas of greatest tenderness with moderate relief. She began to walk with a cane yesterday and states that this has further taken pressure off of her left leg and provided some pain relief. Her pain is currently "not that bad" and has improved greatly with the exercises. She says that tylenol arthritis strength "doesn't work" but she has not been taking it consistently as recommended at her previous appointment 1 week ago. Walking makes pain to left lower extremity worse and she has not returned to work since she was seen here at urgent care last week.  She states she "just wants to be done with the pain and wants it to go away". Denies numbness/tingling to bilateral lower extremities, nausea, dizziness, fever, headache, and rash at this time. She denies pain to her right lower extremity. She has a history of focal segmental glomerulosclerosis and is unable to take NSAIDS. No other aggravating or relieving factors for pain identified.    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 On appropriate therapy with ACE-i    Hyperlipidemia    Hypertension    Left ankle pain 06/26/2016   Pain in both upper arms 09/05/2016   Severe obesity (BMI >= 40) (HCC)    BMI 55   Venous insufficiency of both lower extremities 05/21/2017    Patient Active Problem List   Diagnosis Date Noted   Iron deficiency anemia 10/20/2021   Tremor 12/20/2015   Numbness and tingling in both hands 05/28/2015   Health care maintenance 12/23/2013   FSGS (focal segmental glomerulosclerosis) 07/02/2012   Hyperlipidemia 10/25/2006   Severe obesity (BMI >= 40) (HCC) 10/25/2006   Essential hypertension 10/25/2006    Past Surgical History:  Procedure Laterality Date   RENAL BIOPSY  1999    OB History   No obstetric history on file.      Home Medications    Prior to Admission medications   Medication Sig Start Date End Date Taking? Authorizing Provider  acetaminophen (TYLENOL) 500 MG tablet Take 2 tablets (1,000 mg total) by mouth every 6 (six) hours as needed. 04/03/22  Yes Carlisle Beers, FNP  enalapril (VASOTEC) 20 MG tablet Take 2 tablets by mouth once daily 10/24/21   Masters, Florentina Addison, DO  hydrochlorothiazide (HYDRODIURIL) 25 MG tablet Take 1 tablet by mouth once daily 10/24/21   Masters, Florentina Addison, DO  methylPREDNISolone (MEDROL DOSEPAK) 4 MG TBPK tablet Take following package directions. 09/06/21   Waldon Merl, PA-C    Family History Family History  Problem Relation Age of  Onset   Hypertension Mother    Hypertension Father    Diabetes Father     Social History Social History   Tobacco Use   Smoking status: Never   Smokeless tobacco: Never  Substance Use Topics   Alcohol use: No    Alcohol/week: 0.0 standard drinks   Drug use: No     Allergies   Patient has no known allergies.   Review of Systems Review of Systems Per HPI  Physical Exam Triage Vital Signs ED Triage Vitals  Enc Vitals Group     BP 04/03/22 0920 115/68     Pulse Rate 04/03/22 0920 89     Resp 04/03/22 0920 18     Temp  04/03/22 0920 99.3 F (37.4 C)     Temp src --      SpO2 04/03/22 0920 95 %     Weight --      Height --      Head Circumference --      Peak Flow --      Pain Score 04/03/22 0917 6     Pain Loc --      Pain Edu? --      Excl. in GC? --    No data found.  Updated Vital Signs BP 115/68   Pulse 89   Temp 99.3 F (37.4 C)   Resp 18   LMP 03/12/2022   SpO2 95%   Visual Acuity Right Eye Distance:   Left Eye Distance:   Bilateral Distance:    Right Eye Near:   Left Eye Near:    Bilateral Near:     Physical Exam Vitals and nursing note reviewed.  Constitutional:      General: She is not in acute distress.    Appearance: Normal appearance. She is well-developed. She is obese. She is not ill-appearing.  HENT:     Head: Normocephalic and atraumatic.     Right Ear: External ear normal.     Left Ear: External ear normal.     Nose: Nose normal.     Mouth/Throat:     Mouth: Mucous membranes are moist.  Eyes:     Extraocular Movements: Extraocular movements intact.     Conjunctiva/sclera: Conjunctivae normal.  Cardiovascular:     Rate and Rhythm: Normal rate and regular rhythm.     Heart sounds: Normal heart sounds. No murmur heard.   No friction rub. No gallop.  Pulmonary:     Effort: Pulmonary effort is normal. No respiratory distress.     Breath sounds: Normal breath sounds. No wheezing, rhonchi or rales.  Chest:     Chest wall: No tenderness.  Abdominal:     Palpations: Abdomen is soft.     Tenderness: There is no abdominal tenderness. There is no right CVA tenderness or left CVA tenderness.  Musculoskeletal:        General: Tenderness present. No swelling.     Cervical back: Normal range of motion and neck supple.     Right lower leg: No edema.     Left lower leg: No edema.     Comments: Left leg/hip: Tenderness to left IT band. No erythema or ecchymosis noted. Normal range of motion to bilateral knees. Bony tenderness to palpation of left hip. Neurovascularly  intact distal to injury. Patient ambulates with mild antalgic gait due to hip pain.   Lymphadenopathy:     Cervical: No cervical adenopathy.  Skin:    General: Skin is warm and dry.  Capillary Refill: Capillary refill takes less than 2 seconds.     Findings: No rash.  Neurological:     General: No focal deficit present.     Mental Status: She is alert and oriented to person, place, and time.  Psychiatric:        Mood and Affect: Mood normal.        Behavior: Behavior normal.        Thought Content: Thought content normal.        Judgment: Judgment normal.     UC Treatments / Results  Labs (all labs ordered are listed, but only abnormal results are displayed) Labs Reviewed - No data to display  EKG   Radiology No results found.  Procedures Procedures (including critical care time)  Medications Ordered in UC Medications - No data to display  Initial Impression / Assessment and Plan / UC Course  I have reviewed the triage vital signs and the nursing notes.  Pertinent labs & imaging results that were available during my care of the patient were reviewed by me and considered in my medical decision making (see chart for details).  Patient is a 41 year old female with ongoing left hip pain seen last week at urgent care for same complaint.  Symptoms have improved after exercises recommended by Dr. Shon Baton last week.  Patient attempted to call orthopedic specialist for appointment, but found out that they do not take her insurance.  Subjective tenderness has improved significantly since last visit last week and patient does not report any pain at this time in seated position or with ambulation.  Helped patient figure out orthopedic specialist who is covered by her insurance.  Dr. Rise Paganini in the [facility] health network is covered.  Walking referral given for Dr. Diamantina Providence office and patient instructed to call today to schedule an appointment for soon as possible for ongoing hip pain.   Information given for [facility] if she is unable to be seen by Dr. August Saucer in a timely manner for evaluation of worsening symptoms.  Hip pain is stable at this time.  Recommend patient take Tylenol arthritis strength for pain consistently for relief of discomfort.  She is unable to take NSAIDs due to kidney disease.  Continue to use Voltaren or Aspercreme as needed. She is to continue to rest and return to work in the next couple of days if tolerated. Work note given.   Counseled patient regarding appropriate use of medications and potential side effects for all medications recommended or prescribed today. Discussed red flag signs and symptoms of worsening condition,when to call the PCP office, return to urgent care, and when to seek higher level of care. Patient verbalizes understanding and agreement with plan. All questions answered. Patient discharged in stable condition.  Final Clinical Impressions(s) / UC Diagnoses   Final diagnoses:  Left hip pain     Discharge Instructions      You were seen today for your ongoing hip pain.  Continue to apply heat and ice to areas of greatest tenderness.  Continue exercises you were shown by Dr. Shon Baton 1 week ago as they are helping with your pain.  Take Tylenol every 6 hours as needed for pain and discomfort.  Call Dr. Diamantina Providence office today to schedule an appointment for soon as possible to be seen due to ongoing symptoms.  If you are unable to get an appointment with Dr. August Saucer (Ortho covered by your insurance) soon, you may go to [facility] urgent care for evaluation.  ED Prescriptions     Medication Sig Dispense Auth. Provider   acetaminophen (TYLENOL) 500 MG tablet Take 2 tablets (1,000 mg total) by mouth every 6 (six) hours as needed. 30 tablet Carlisle BeersStanhope, Shaianne Nucci M, FNP      PDMP not reviewed this encounter.   Carlisle BeersStanhope, Boone Gear M, FNP 04/03/22 1101    Reita MayStanhope, Adea Geisel St. DavidM, [place]NP 04/03/22 1102

## 2022-04-03 NOTE — Discharge Instructions (Addendum)
You were seen today for your ongoing hip pain.  Continue to apply heat and ice to areas of greatest tenderness.  Continue exercises you were shown by Dr. Shon Baton 1 week ago as they are helping with your pain.  Take Tylenol every 6 hours as needed for pain and discomfort.  Call Dr. Diamantina Providence office today to schedule an appointment for soon as possible to be seen due to ongoing symptoms.  If you are unable to get an appointment with Dr. August Saucer (Ortho covered by your insurance) soon, you may go to [facility] urgent care for evaluation.

## 2022-04-04 ENCOUNTER — Encounter: Payer: Self-pay | Admitting: Physician Assistant

## 2022-04-04 ENCOUNTER — Ambulatory Visit (INDEPENDENT_AMBULATORY_CARE_PROVIDER_SITE_OTHER): Payer: PRIVATE HEALTH INSURANCE

## 2022-04-04 ENCOUNTER — Ambulatory Visit (INDEPENDENT_AMBULATORY_CARE_PROVIDER_SITE_OTHER): Payer: PRIVATE HEALTH INSURANCE | Admitting: Physician Assistant

## 2022-04-04 DIAGNOSIS — M25552 Pain in left hip: Secondary | ICD-10-CM

## 2022-04-04 DIAGNOSIS — M79605 Pain in left leg: Secondary | ICD-10-CM | POA: Diagnosis not present

## 2022-04-04 NOTE — Progress Notes (Signed)
Office Visit Note   Patient: Deborah Gonzales           Date of Birth: [birth year]           MRN: [id number] Visit Date: 04/04/2022              Requested by: Masters, Auberry, DO [address] [place],  [place] [address] PCP: Rudene Christians, DO  Chief Complaint  Patient presents with   Left Hip - Pain      HPI: Patient is a pleasant 41 year old woman with a history of left lateral hip pain.  She noticed this was started after she had been doing some weight lifting with squats.  She has been seen in urgent care where they did prescribe back to her sizes and it seems to be helping her quite a bit.  She also uses topical Aspercreme and Tylenol arthritis.  She does have some kidney issues so she has been told not to take anti-inflammatories.  She denies any numbing tingling or radiation into her foot denies any back or buttock pain.  She does ambulate with a cane still because it relieves some of the pain  Assessment & Plan: Visit Diagnoses:  1. Pain of left hip     Plan: Findings most consistent with IT band tendinitis.  Certainly she is getting better this is encouraging.  I did recommend short course of physical therapy so she can learn how to do exercises with out having recurrence of her symptoms.  We will follow-up in 4 weeks if needed  Follow-Up Instructions: No follow-ups on file.   Ortho Exam  Patient is alert, oriented, no adenopathy, well-dressed, normal affect, normal respiratory effort. Left leg she is tender along the course of the IT band with resistance.  She has 5 out of 5 strength with resisted dorsiflexion plantarflexion of her ankles flexion extension of her knees no tenderness over the posterior buttock or lower back  Imaging: No results found. No images are attached to the encounter.  Labs: Lab Results  Component Value Date   HGBA1C 5.5 10/18/2021   HGBA1C 5.5 05/21/2017   HGBA1C 5.8 11/26/2015     Lab Results  Component Value Date   ALBUMIN  4.1 10/18/2021   ALBUMIN 3.8 11/19/2017   ALBUMIN 3.7 10/19/2014    No results found for: MG No results found for: VD25OH  No results found for: PREALBUMIN    Latest Ref Rng & Units 10/18/2021   10:36 AM 05/21/2017   10:07 AM 06/26/2016    4:18 PM  CBC EXTENDED  WBC 3.4 - 10.8 x10E3/uL 4.7   5.9   7.9    RBC 3.77 - 5.28 x10E6/uL 4.48   4.53   4.56    Hemoglobin 11.1 - 15.9 g/dL 97.4   16.3   84.5    HCT 34.0 - 46.6 % 37.0   37.4   38.1    Platelets 150 - 450 x10E3/uL 448   364   437    NEUT# 1.4 - 7.0 x10E3/uL 2.8      Lymph# 0.7 - 3.1 x10E3/uL 1.2         There is no height or weight on file to calculate BMI.  Orders:  Orders Placed This Encounter  Procedures   XR HIP UNILAT W OR W/O PELVIS 2-3 VIEWS LEFT   No orders of the defined types were placed in this encounter.    Procedures: No procedures performed  Clinical Data: No additional findings.  ROS:  All other systems negative, except as noted in the HPI. Review of Systems  Objective: Vital Signs: LMP 03/12/2022   Specialty Comments:  No specialty comments available.  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 On appropriate therapy with ACE-i   Hyperlipidemia    Hypertension    Left ankle pain 06/26/2016   Pain in both upper arms 09/05/2016   Severe obesity (BMI >= 40) (HCC)    BMI 55   Venous insufficiency of both lower extremities 05/21/2017    Family History  Problem Relation Age of Onset   Hypertension Mother    Hypertension  Father    Diabetes Father     Past Surgical History:  Procedure Laterality Date   RENAL BIOPSY  1999   Social History   Occupational History   Occupation: Occupational psychologist    Comment: CNA/med tech  Tobacco Use   Smoking status: Never   Smokeless tobacco: Never  Substance and Sexual Activity   Alcohol use: No    Alcohol/week: 0.0 standard drinks   Drug use: No   Sexual activity: Not on file

## 2022-04-05 ENCOUNTER — Telehealth: Payer: Self-pay | Admitting: Physician Assistant

## 2022-04-05 ENCOUNTER — Other Ambulatory Visit: Payer: Self-pay

## 2022-04-05 DIAGNOSIS — M25552 Pain in left hip: Secondary | ICD-10-CM

## 2022-04-05 NOTE — Telephone Encounter (Signed)
Patient called. She would like physical therapy referral to come here. Her call back number is [phone]

## 2022-04-05 NOTE — Telephone Encounter (Signed)
Called. LMOM that the order for PT has been placed. Someone will be calling to get her scheduled.

## 2022-04-17 NOTE — Therapy (Signed)
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 On appropriate therapy with ACE-i   Hyperlipidemia    Hypertension    Left ankle pain 06/26/2016   Pain in both upper arms 09/05/2016   Severe obesity (BMI >= 40) (HCC)    BMI 55   Venous insufficiency of both lower extremities 05/21/2017   Past Surgical History:  Procedure Laterality Date   RENAL BIOPSY  1999   Patient Active Problem List   Diagnosis Date Noted   Pain in left leg 04/04/2022   Iron deficiency anemia 10/20/2021   Tremor 12/20/2015   Numbness and tingling in both hands 05/28/2015   Health care maintenance 12/23/2013   FSGS (focal segmental glomerulosclerosis) 07/02/2012   Hyperlipidemia 10/25/2006   Severe obesity (BMI >= 40) (HCC) 10/25/2006   Essential hypertension 10/25/2006    PCP: Rudene ChristiansKatie Masters, DO  REFERRING PROVIDER: Valeria BatmanPeter W Whitfield, MD  REFERRING DIAG: [phone] (ICD-10-CM) - Pain of left hip   THERAPY DIAG:  Difficulty in walking, not elsewhere classified  Muscle weakness (generalized)  Stiffness of left hip, not elsewhere classified  Pain in left hip  Rationale for Evaluation and Treatment Rehabilitation  ONSET DATE: Mar 28, 2022.  Started in April but got bad on 03/28/2022.  SUBJECTIVE:   SUBJECTIVE STATEMENT: Dr. Cleophas DunkerWhitfield diagnosed left hip IT Band tendonitis.    PERTINENT HISTORY: Hyperlipidemia, HTN, severe obesity,  venous insufficiency B LE, B numbness and tingling U and LEs  PAIN:  Are you having pain? Yes: NPRS scale: 3-6/10 Pain location: L hip to the lateral knee Pain description: Sore with some sharp pain Aggravating factors: Worse with activity Relieving factors: Change of position  PRECAUTIONS: None  WEIGHT BEARING RESTRICTIONS No  FALLS:  Has patient fallen in last 6 months? Yes. 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  No acute fractures femoral head is well  reduced with congruent joint spacing  PATIENT SURVEYS:  FOTO 57 (Goal 75 in 12 visits)  COGNITION:  Overall cognitive status: Within functional limits for tasks assessed     SENSATION: No tingling or numbness   MUSCLE LENGTH: Hamstrings: Right 45 deg; Left 45 deg  LOWER EXTREMITY ROM:  Passive ROM Right eval Left eval  Hip flexion 75 65  Hip extension    Hip abduction    Hip adduction    Hip internal rotation 8 0  Hip external rotation 22 24  Knee flexion  Knee extension    Ankle dorsiflexion    Ankle plantarflexion    Ankle inversion    Ankle eversion     (Blank rows = not tested)  LOWER EXTREMITY MMT:  MMT Right eval Left eval  Hip flexion    Hip extension    Hip abduction    Hip adduction    Hip internal rotation    Hip external rotation    Knee flexion    Knee extension    Ankle dorsiflexion    Ankle plantarflexion    Ankle inversion    Ankle eversion     (Blank rows = not tested)  LOWER EXTREMITY SPECIAL TESTS:  Hip special tests: Ober's test: positive    GAIT: Comments: Deborah Gonzales is  using a cane to decrease pressure on her L hip/IT Band.    TODAY'S TREATMENT: Modified Thomas stretch 2X 20 seconds B Figure 4 stretch 2X 20 seconds B Clam (lie R, L side works) 2 sets of 10 for 3 seconds   PATIENT EDUCATION:  Education details: Reviewed exam findings and HEP Person educated: Patient Education method: Programmer, multimedia, Demonstration, Tactile cues, Verbal cues, and Handouts Education comprehension: verbalized understanding, returned demonstration, verbal cues required, tactile cues required, and needs further education   HOME EXERCISE PROGRAM: Access Code: [email or web address] URL: [email or web address] Date: 04/18/2022 Prepared by: Pauletta Browns  Exercises - Supine Figure 4 Piriformis Stretch  - 2-3 x daily - 7 x weekly - 1 sets - 5 reps - 20 seconds hold - Modified Thomas Stretch  - 2-3 x daily - 7 x weekly - 1 sets - 5 reps - 20 seconds hold - Clam  - 2-3 x daily - 7 x weekly - 1-2 sets - 10 reps - 3 seconds hold  ASSESSMENT:  CLINICAL IMPRESSION: Patient is a 41 y.o. female who was seen today for physical therapy evaluation and treatment for L hip IT Band tendonitis.    OBJECTIVE IMPAIRMENTS Abnormal gait, decreased activity tolerance, decreased endurance, decreased knowledge of condition, difficulty walking, decreased ROM, decreased strength, increased edema, impaired perceived functional ability, impaired flexibility, obesity, and pain.   ACTIVITY LIMITATIONS carrying, bending, sitting, standing, squatting, sleeping, stairs, and locomotion level  PARTICIPATION LIMITATIONS: community activity and occupation  PERSONAL FACTORS Hyperlipidemia, HTN, severe obesity, venous insufficiency B LE, B numbness and tingling U and LEs are also affecting patient's functional outcome.   REHAB POTENTIAL: Good  CLINICAL DECISION MAKING: Stable/uncomplicated  EVALUATION COMPLEXITY: Low   GOALS: Goals reviewed with patient? Yes  SHORT TERM GOALS: Target date:  05/09/2022   Improve gait quality so Deborah Gonzales no longer needs a cane. Baseline: Needs cane 04/18/2022 Goal status: INITIAL   LONG TERM GOALS: Target date: 05/30/2022   Improve FOTO to 75 in 12 visits. Baseline: 57 Goal status: INITIAL  2.  Deborah Gonzales will report L hip/IT Band pain consistently 0-2/10 on the Numeric Pain Rating Scale. Baseline: 3-6/10 Goal status: INITIAL  3.  Improve B hip flexibility for hip flexors to 90 degrees and hip ER to 40 degrees. Baseline: (L/R): 65/75 and 24/22 respectively Goal status: INITIAL  4.  Deborah Gonzales will be able to return to work without a cane. Baseline: Not working and using a cane Goal status: INITIAL  5.  Deborah Gonzales will be independent with her long-term HEP at DC. Baseline: Started 04/18/2022 Goal status: INITIAL   PLAN: PT FREQUENCY: 1-2x/week  PT DURATION: 6 weeks  PLANNED INTERVENTIONS: Therapeutic exercises, Therapeutic activity, Neuromuscular re-education, Balance training, Gait training, Patient/Family education, Joint mobilization, Stair  training, Dry Needling, Cryotherapy, and Manual therapy  PLAN FOR NEXT SESSION: Check HEP.  Consider bridging, hip hike in parallel bars, Nu Step, tandem balance.  LE stretching and strengthening PRN.   Cherlyn Cushing, PT, MPT 04/18/2022, 2:44 PM

## 2022-04-18 ENCOUNTER — Encounter: Payer: Self-pay | Admitting: Rehabilitative and Restorative Service Providers"

## 2022-04-18 ENCOUNTER — Ambulatory Visit (INDEPENDENT_AMBULATORY_CARE_PROVIDER_SITE_OTHER): Payer: PRIVATE HEALTH INSURANCE | Admitting: Rehabilitative and Restorative Service Providers"

## 2022-04-18 DIAGNOSIS — R262 Difficulty in walking, not elsewhere classified: Secondary | ICD-10-CM

## 2022-04-18 DIAGNOSIS — M25652 Stiffness of left hip, not elsewhere classified: Secondary | ICD-10-CM | POA: Diagnosis not present

## 2022-04-18 DIAGNOSIS — M25552 Pain in left hip: Secondary | ICD-10-CM

## 2022-04-18 DIAGNOSIS — M6281 Muscle weakness (generalized): Secondary | ICD-10-CM | POA: Diagnosis not present

## 2022-04-18 NOTE — Progress Notes (Signed)
   CC: follow up  HPI:  [name] is a 41 y.o. with medical history as below presenting to [facility] for follow up  Please see problem-based list for further details, assessments, and plans.  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 On appropriate therapy with ACE-i   Hyperlipidemia    Hypertension    Left ankle pain 06/26/2016   Pain in both upper arms 09/05/2016   Severe obesity (BMI >= 40) (HCC)    BMI 55   Venous insufficiency of both lower extremities 05/21/2017   Review of Systems:  Review of system negative unless stated in the problem list or HPI.    Physical Exam:  Vitals:   04/19/22 0933  BP: 130/88  Pulse: 86  Temp: 98.6 F (37 C)  TempSrc: Oral  SpO2: 97%  Weight: (!) 315 lb 9.6 oz (143.2 kg)  Height: 5\' 5"  (1.651 m)     Physical Exam General: NAD HENT: NCAT Lungs: CTAB, no wheeze, rhonchi or rales.  Cardiovascular: Normal heart sounds, no r/m/g, 2+ pulses in all extremities. No LE edema Abdomen: No TTP, normal bowel sounds MSK: No asymmetry or muscle atrophy. Wobbly gait.  Skin: no lesions noted on exposed skin Neuro: Alert and oriented x4. CN grossly intact Psych: Normal mood and normal affect   Assessment & Plan:   See Encounters Tab for problem based charting.  Patient discussed with Dr. Edrick Kins, MD

## 2022-04-19 ENCOUNTER — Other Ambulatory Visit: Payer: Self-pay

## 2022-04-19 ENCOUNTER — Encounter: Payer: Self-pay | Admitting: Internal Medicine

## 2022-04-19 ENCOUNTER — Ambulatory Visit (INDEPENDENT_AMBULATORY_CARE_PROVIDER_SITE_OTHER): Payer: No Typology Code available for payment source | Admitting: Internal Medicine

## 2022-04-19 VITALS — BP 130/88 | HR 86 | Temp 98.6°F | Ht 65.0 in | Wt 315.6 lb

## 2022-04-19 DIAGNOSIS — M7632 Iliotibial band syndrome, left leg: Secondary | ICD-10-CM | POA: Diagnosis not present

## 2022-04-19 DIAGNOSIS — N051 Unspecified nephritic syndrome with focal and segmental glomerular lesions: Secondary | ICD-10-CM

## 2022-04-19 DIAGNOSIS — I10 Essential (primary) hypertension: Secondary | ICD-10-CM

## 2022-04-19 NOTE — Patient Instructions (Addendum)
Ms.Lakeeta A Sarracino, it was a pleasure seeing you today! You endorsed feeling well today. Below are some of the things we talked about this visit. We look forward to seeing you in the follow up appointment!  Today we discussed: Your blood pressure was elevated today but you do not check your blood pressure at home.  I would like you to check your blood pressure at home and take a log to the visit with your nephrologist so they can decide if your blood pressures adequately controlled at home.  You did not want to start any medications at all. For your leg pain, I will fill out the paperwork that you need to take some time off work.  Please continue going to physical therapy and follow-up with orthopedic if needed.  I have ordered the following labs today:  Lab Orders         Microalbumin / Creatinine Urine Ratio        Referrals ordered today:   Referral Orders  No referral(s) requested today     I have ordered the following medication/changed the following medications:   Stop the following medications: Medications Discontinued During This Encounter  Medication Reason   methylPREDNISolone (MEDROL DOSEPAK) 4 MG TBPK tablet Completed Course     Start the following medications: No orders of the defined types were placed in this encounter.    Follow-up: 6 months  Please make sure to arrive 15 minutes prior to your next appointment. If you arrive late, you may be asked to reschedule.   We look forward to seeing you next time. Please call our clinic at [phone] if you have any questions or concerns. The best time to call is Monday-Friday from 9am-4pm, but there is someone available 24/7. If after hours or the weekend, call the main hospital number and ask for the Internal Medicine Resident On-Call. If you need medication refills, please notify your pharmacy one week in advance and they will send [place] a request.  Thank you for letting us take part in your care. Wishing you the  best!  Thank you, Gwenevere Abbot, MD

## 2022-04-21 ENCOUNTER — Ambulatory Visit (INDEPENDENT_AMBULATORY_CARE_PROVIDER_SITE_OTHER): Payer: PRIVATE HEALTH INSURANCE | Admitting: Physical Therapy

## 2022-04-21 ENCOUNTER — Encounter: Payer: Self-pay | Admitting: Physical Therapy

## 2022-04-21 DIAGNOSIS — M6281 Muscle weakness (generalized): Secondary | ICD-10-CM

## 2022-04-21 DIAGNOSIS — M25552 Pain in left hip: Secondary | ICD-10-CM | POA: Diagnosis not present

## 2022-04-21 DIAGNOSIS — M25652 Stiffness of left hip, not elsewhere classified: Secondary | ICD-10-CM | POA: Diagnosis not present

## 2022-04-21 DIAGNOSIS — M7632 Iliotibial band syndrome, left leg: Secondary | ICD-10-CM | POA: Insufficient documentation

## 2022-04-21 DIAGNOSIS — R262 Difficulty in walking, not elsewhere classified: Secondary | ICD-10-CM

## 2022-04-21 NOTE — Assessment & Plan Note (Addendum)
BP close to goal (< 130/80). BP in clinic today 130/88, HR 86.  Does not monitor BP level at home; educated on the importance of it. Home medications include enalapril 20 mg qd, HCTZ 25 mg qd. Reports good medication compliance. Tolerating medication without adverse effects. Would like to continue same regimen for now. Denies headaches, vision changes, lightheadedness, chest pain, SHOB, leg swelling or changes in speech. Exam benign and vitals otherwise wnl. Last creatinine was 0.50 in 12/06. Counseled on the importance of daily exercise, low salt diet, and weight loss. -Continue enalapril 20 mg qd, HCTZ 25 mg qd. -BP log and bring to next visit  -Continue lifestyle changes -Follow with [facility] as scheduled.

## 2022-04-21 NOTE — Assessment & Plan Note (Signed)
Patient has left hip pain with diagnosis of left IT band tendonitis. She has PT scheduled for next 6 weeks and will benefit from time off work given her job requirements.  --Will complete FMLA and short term disability paperwork -Recommend follow up PT -Counseled on returning to clinic if symptoms do not improve after 6 weeks.

## 2022-04-21 NOTE — Progress Notes (Signed)
Internal Medicine Clinic Attending  Case discussed with Dr. Khan  At the time of the visit.  We reviewed the resident's history and exam and pertinent patient test results.  I agree with the assessment, diagnosis, and plan of care documented in the resident's note.  

## 2022-04-21 NOTE — Assessment & Plan Note (Signed)
Follows with [facility] on a yearly basis. Last visit in 08/2021. 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

## 2022-04-21 NOTE — Therapy (Signed)
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 On appropriate therapy with ACE-i   Hyperlipidemia    Hypertension    Left ankle pain 06/26/2016   Pain in both upper arms 09/05/2016   Severe obesity (BMI >= 40) (HCC)    BMI 55   Venous insufficiency of both lower extremities 05/21/2017   Past Surgical History:  Procedure Laterality Date   RENAL BIOPSY  1999   Patient Active Problem List   Diagnosis Date Noted   It band syndrome, left 04/21/2022   Pain in left leg 04/04/2022   Iron deficiency anemia 10/20/2021   Tremor 12/20/2015   Numbness and tingling in both hands 05/28/2015   Health care maintenance 12/23/2013   FSGS (focal segmental glomerulosclerosis) 07/02/2012   Hyperlipidemia 10/25/2006   Severe obesity (BMI >= 40) (HCC) 10/25/2006   Essential hypertension 10/25/2006     THERAPY DIAG:  Difficulty in walking, not elsewhere classified  Muscle weakness (generalized)  Stiffness of left hip, not elsewhere classified  Pain in left hip  PCP: Rudene Christians, DO   REFERRING PROVIDER: Valeria Batman, MD   REFERRING DIAG: [phone] (ICD-10-CM) - Pain of left hip   Rationale for Evaluation and Treatment Rehabilitation   ONSET DATE: Mar 28, 2022.  Started in April but got bad on 03/28/2022.   SUBJECTIVE:    SUBJECTIVE STATEMENT: She says her pain is overall better, she arrives with her cane but  carries it sometimes and does not use it unless she has too.    PERTINENT HISTORY: Hyperlipidemia, HTN, severe obesity, venous insufficiency B LE, B numbness and tingling U and LEs   PAIN:  Are you having pain? Yes: NPRS scale: 2/10 Pain location: L hip to the lateral knee Pain description: Sore with some sharp pain Aggravating factors: Worse with activity Relieving factors: Change of position   PRECAUTIONS: None   WEIGHT BEARING RESTRICTIONS No   FALLS:  Has patient fallen in last 6 months? Yes. 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  No acute fractures femoral head is well  reduced with congruent joint spacing   PATIENT SURVEYS:  FOTO 57 (Goal 75 in 12 visits)   COGNITION:           Overall cognitive status: Within functional limits for tasks assessed  SENSATION: No tingling or numbness     MUSCLE LENGTH: Hamstrings: Right 45 deg; Left 45 deg   LOWER EXTREMITY ROM:   Passive ROM Right eval Left eval  Hip flexion 75 65  Hip extension      Hip abduction      Hip adduction      Hip internal rotation 8 0  Hip external rotation 22 24  Knee flexion      Knee extension      Ankle dorsiflexion      Ankle plantarflexion      Ankle inversion      Ankle eversion       (Blank rows = not tested)   LOWER EXTREMITY MMT:   MMT Right 04/21/22 Left 04/21/22  Hip flexion  4+ 4+   Hip extension      Hip abduction  4  4  Hip adduction  4 4   Hip internal rotation      Hip external rotation      Knee flexion 5  5   Knee extension  5  4+  Ankle  dorsiflexion      Ankle plantarflexion      Ankle inversion      Ankle eversion       (Blank rows = not tested)   LOWER EXTREMITY SPECIAL TESTS:  Hip special tests: Ober's test: positive      GAIT: Comments: Lanora Manislizabeth is using a cane to decrease pressure on her L hip/IT Band.       TODAY'S TREATMENT:  04/21/22 Nu step X 8 min L5 LE only Modified Thomas stretch 2X 30 seconds B Figure 4 stretch on Lt 2X 30 seconds B Supine ITB stretch with strap 3 X 30 seconds in Left Clam (lie R, L side works) 15 reps for 3 seconds Reverse clam (lift foot and keep knees together) 15 reps for 3 seconds Supine Bridges 3 sec hold X15 Tandem balance 30 sec X 2 bilat Hip hike X 10 bilat  Manual therapy: Massage gun to Lt ITB, quads X 5 min     PATIENT EDUCATION:  Education details: Reviewed exam findings and HEP Person educated: Patient Education method: Explanation, Demonstration, Tactile cues, Verbal cues, and Handouts Education comprehension: verbalized understanding, returned demonstration, verbal cues required, tactile cues required, and needs further education     HOME EXERCISE PROGRAM: Access Code: [email or web address] URL: [email or web address] Date: 04/18/2022 Prepared by: Pauletta Brownsobert Lovell   Exercises - Supine Figure 4 Piriformis Stretch  - 2-3 x daily - 7 x weekly - 1 sets - 5 reps - 20 seconds hold - Modified Thomas Stretch  - 2-3 x daily - 7 x weekly - 1 sets - 5 reps - 20 seconds hold - Clam  - 2-3 x daily - 7 x weekly - 1-2 sets - 10 reps - 3 seconds hold   ASSESSMENT:   CLINICAL IMPRESSION: She shows good early compliance to HEP and is showing some early progress with pain. We reviewed HEP and she shows good understanding and return demonstration. I added some additional hip strengthening and ITB stretch today and she showed good overall tolerance to this. I trialed massage gun to see if this also helps reduce any pain.      OBJECTIVE IMPAIRMENTS Abnormal gait, decreased  activity tolerance, decreased endurance, decreased knowledge of condition, difficulty walking, decreased ROM, decreased strength, increased edema, impaired perceived functional ability, impaired flexibility, obesity, and pain.    ACTIVITY LIMITATIONS carrying, bending, sitting, standing, squatting, sleeping,  stairs, and locomotion level   PARTICIPATION LIMITATIONS: community activity and occupation   PERSONAL FACTORS Hyperlipidemia, HTN, severe obesity, venous insufficiency B LE, B numbness and tingling U and LEs are also affecting patient's functional outcome.    REHAB POTENTIAL: Good   CLINICAL DECISION MAKING: Stable/uncomplicated   EVALUATION COMPLEXITY: Low     GOALS: Goals reviewed with patient? Yes   SHORT TERM GOALS: Target date: 05/09/2022    Improve gait quality so Natascha no longer needs a cane. Baseline: Needs cane 04/18/2022 Goal status: INITIAL     LONG TERM GOALS: Target date: 05/30/2022    Improve FOTO to 75 in 12 visits. Baseline: 57 Goal status: INITIAL   2.  Ardene will report L hip/IT Band pain consistently 0-2/10 on the Numeric Pain Rating Scale. Baseline: 3-6/10 Goal status: INITIAL   3.  Improve B hip flexibility for hip flexors to 90 degrees and hip ER to 40 degrees. Baseline: (L/R): 65/75 and 24/22 respectively Goal status: INITIAL   4.  Elida will be able to return to work without a cane. Baseline: Not working and using a cane Goal status: INITIAL   5.  Estoria will be independent with her long-term HEP at [place]. Baseline: Started 04/18/2022 Goal status: INITIAL     PLAN: PT FREQUENCY: 1-2x/week   PT DURATION: 6 weeks   PLANNED INTERVENTIONS: Therapeutic exercises, Therapeutic activity, Neuromuscular re-education, Balance training, Gait training, Patient/Family education, Joint mobilization, Stair training, Dry Needling, Cryotherapy, and Manual therapy   PLAN FOR NEXT SESSION:  how was massage gun, continue with left hip stretching  and strength program gentle to tolerance   April Manson, PT,DPT 04/21/2022, 8:44 AM

## 2022-04-24 ENCOUNTER — Other Ambulatory Visit: Payer: Self-pay | Admitting: Internal Medicine

## 2022-04-24 DIAGNOSIS — I10 Essential (primary) hypertension: Secondary | ICD-10-CM

## 2022-04-28 ENCOUNTER — Encounter: Payer: Self-pay | Admitting: Physical Therapy

## 2022-04-28 ENCOUNTER — Ambulatory Visit (INDEPENDENT_AMBULATORY_CARE_PROVIDER_SITE_OTHER): Payer: PRIVATE HEALTH INSURANCE | Admitting: Physical Therapy

## 2022-04-28 DIAGNOSIS — R262 Difficulty in walking, not elsewhere classified: Secondary | ICD-10-CM

## 2022-04-28 DIAGNOSIS — M25552 Pain in left hip: Secondary | ICD-10-CM

## 2022-04-28 DIAGNOSIS — M25652 Stiffness of left hip, not elsewhere classified: Secondary | ICD-10-CM

## 2022-04-28 DIAGNOSIS — M6281 Muscle weakness (generalized): Secondary | ICD-10-CM

## 2022-04-28 NOTE — Therapy (Signed)
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 On appropriate therapy with ACE-i   Hyperlipidemia    Hypertension    Left ankle pain 06/26/2016   Pain in both upper arms 09/05/2016   Severe obesity (BMI >= 40) (HCC)    BMI 55   Venous insufficiency of both lower extremities 05/21/2017   Past Surgical History:  Procedure Laterality Date   RENAL BIOPSY  1999   Patient Active Problem List   Diagnosis Date Noted   It band syndrome, left 04/21/2022   Pain in left leg 04/04/2022   Iron deficiency anemia 10/20/2021   Tremor 12/20/2015   Numbness and tingling in both hands 05/28/2015   Health care maintenance 12/23/2013   FSGS (focal segmental glomerulosclerosis) 07/02/2012   Hyperlipidemia 10/25/2006   Severe obesity (BMI >= 40) ([place]) 10/25/2006   Essential hypertension 10/25/2006     THERAPY DIAG:  Difficulty in walking, not elsewhere classified  Muscle weakness (generalized)  Stiffness of left hip, not elsewhere classified  Pain in left hip  PCP: Christiana Fuchs, DO   REFERRING PROVIDER: Garald Balding, MD   REFERRING DIAG: [phone] (ICD-10-CM) - Pain of left hip   Rationale for Evaluation and Treatment Rehabilitation   ONSET DATE: Mar 28, 2022.  Started in April but got bad on 03/28/2022.   SUBJECTIVE:    SUBJECTIVE STATEMENT: She relays the pain is not too bad today, it sometimes gets worse in  the evenings   PERTINENT HISTORY: Hyperlipidemia, HTN, severe obesity, venous insufficiency B LE, B numbness and tingling U and LEs   PAIN:  Are you having pain? 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  No acute fractures femoral head is well  reduced with congruent joint spacing   PATIENT SURVEYS:  FOTO 57 (Goal 75 in 12 visits)   COGNITION:           Overall cognitive status: Within functional limits for tasks assessed                          SENSATION: No tingling or numbness     MUSCLE LENGTH: Hamstrings: Right 45 deg; Left 45 deg   LOWER EXTREMITY ROM:   Passive ROM Right eval Left eval  Hip flexion 75 65  Hip extension  Hip abduction      Hip adduction      Hip internal rotation 8 0  Hip external rotation 22 24  Knee flexion      Knee extension      Ankle dorsiflexion      Ankle plantarflexion      Ankle inversion      Ankle eversion       (Blank rows = not tested)   LOWER EXTREMITY MMT:   MMT Right 04/21/22 Left 04/21/22  Hip flexion  4+ 4+   Hip extension      Hip abduction  4  4  Hip adduction  4 4   Hip internal rotation      Hip external rotation      Knee flexion 5  5   Knee extension  5  4+  Ankle dorsiflexion      Ankle plantarflexion      Ankle inversion      Ankle eversion       (Blank rows = not tested)   LOWER EXTREMITY SPECIAL TESTS:  Hip special tests: Ober's test: positive      GAIT: Comments: Durga is using a cane to decrease pressure on her L hip/IT  Band.       TODAY'S TREATMENT:  04/28/22 Nu step X 8 min L5  Modified Thomas stretch 3X 20 seconds on left Figure 4 stretch 3 X 30 seconds Bilat Supine ITB stretch with strap 3 X 20 seconds on Left Clam (lie R, L side works) 15 reps for 3 seconds Reverse clam (lift foot and keep knees together) 15 reps for 3 seconds Supine Bridges 3 sec hold 2X10 Standing hip abductions with red band around ankles X 15 bilat Standing hip extensions with red band around ankles  X15 bilat Tandem balance 30 sec X 2 bilat     04/21/22 Nu step X 8 min L5 LE only Modified Thomas stretch 2X 30 seconds B Figure 4 stretch on Lt 2X 30 seconds B Supine ITB stretch with strap 3 X 30 seconds in Left Clam (lie R, L side works) 15 reps for 3 seconds Reverse clam (lift foot and keep knees together) 15 reps for 3 seconds Supine Bridges 3 sec hold X15 Tandem balance 30 sec X 2 bilat Hip hike X 10 bilat  Manual therapy: Massage gun to Lt ITB, quads X 5 min     PATIENT EDUCATION:  Education details: Reviewed exam findings and HEP Person educated: Patient Education method: Explanation, Demonstration, Tactile cues, Verbal cues, and Handouts Education comprehension: verbalized understanding, returned demonstration, verbal cues required, tactile cues required, and needs further education     HOME EXERCISE PROGRAM: Access Code: [email or web address] URL: [email or web address] Date: 04/18/2022 Prepared by: Pauletta Browns   Exercises - Supine Figure 4 Piriformis Stretch  - 2-3 x daily - 7 x weekly - 1 sets - 5 reps - 20 seconds hold - Modified Thomas Stretch  - 2-3 x daily - 7 x weekly - 1 sets - 5 reps - 20 seconds hold - Clam  - 2-3 x daily - 7 x weekly - 1-2 sets - 10 reps - 3 seconds hold   ASSESSMENT:   CLINICAL IMPRESSION: She was not having much pain today to progressed strengthening gradually. Overall she feels pain is slowly improving. She defers manual therapy today or ice today.     OBJECTIVE  IMPAIRMENTS Abnormal gait, decreased activity tolerance, decreased endurance, decreased knowledge of condition, difficulty walking, decreased ROM, decreased strength,  increased edema, impaired perceived functional ability, impaired flexibility, obesity, and pain.    ACTIVITY LIMITATIONS carrying, bending, sitting, standing, squatting, sleeping, stairs, and locomotion level   PARTICIPATION LIMITATIONS: community activity and occupation   PERSONAL FACTORS Hyperlipidemia, HTN, severe obesity, venous insufficiency B LE, B numbness and tingling U and LEs are also affecting patient's functional outcome.    REHAB POTENTIAL: Good   CLINICAL DECISION MAKING: Stable/uncomplicated   EVALUATION COMPLEXITY: Low     GOALS: Goals reviewed with patient? Yes   SHORT TERM GOALS: Target date: 05/09/2022    Improve gait quality so Inaara no longer needs a cane. Baseline: Needs cane 04/18/2022 Goal status: INITIAL     LONG TERM GOALS: Target date: 05/30/2022    Improve FOTO to 75 in 12 visits. Baseline: 57 Goal status: INITIAL   2.  Daley will report L hip/IT Band pain consistently 0-2/10 on the Numeric Pain Rating Scale. Baseline: 3-6/10 Goal status: INITIAL   3.  Improve B hip flexibility for hip flexors to 90 degrees and hip ER to 40 degrees. Baseline: (L/R): 65/75 and 24/22 respectively Goal status: INITIAL   4.  Ileah will be able to return to work without a cane. Baseline: Not working and using a cane Goal status: INITIAL   5.  Halynn will be independent with her long-term HEP at [place]. Baseline: Started 04/18/2022 Goal status: INITIAL     PLAN: PT FREQUENCY: 1-2x/week   PT DURATION: 6 weeks   PLANNED INTERVENTIONS: Therapeutic exercises, Therapeutic activity, Neuromuscular re-education, Balance training, Gait training, Patient/Family education, Joint mobilization, Stair training, Dry Needling, Cryotherapy, and Manual therapy   PLAN FOR NEXT SESSION: continue with left  hip stretching and strength program gentle to tolerance   April Manson, PT,DPT 04/28/2022, 10:48 AM

## 2022-05-01 ENCOUNTER — Encounter: Payer: Self-pay | Admitting: Rehabilitative and Restorative Service Providers"

## 2022-05-01 ENCOUNTER — Ambulatory Visit (INDEPENDENT_AMBULATORY_CARE_PROVIDER_SITE_OTHER): Payer: PRIVATE HEALTH INSURANCE | Admitting: Rehabilitative and Restorative Service Providers"

## 2022-05-01 DIAGNOSIS — M25552 Pain in left hip: Secondary | ICD-10-CM | POA: Diagnosis not present

## 2022-05-01 DIAGNOSIS — M6281 Muscle weakness (generalized): Secondary | ICD-10-CM | POA: Diagnosis not present

## 2022-05-01 DIAGNOSIS — M25652 Stiffness of left hip, not elsewhere classified: Secondary | ICD-10-CM | POA: Diagnosis not present

## 2022-05-01 DIAGNOSIS — R262 Difficulty in walking, not elsewhere classified: Secondary | ICD-10-CM | POA: Diagnosis not present

## 2022-05-01 NOTE — Therapy (Signed)
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 On appropriate therapy with ACE-i   Hyperlipidemia    Hypertension    Left ankle pain 06/26/2016   Pain in both upper arms 09/05/2016   Severe obesity (BMI >= 40) (HCC)    BMI 55   Venous insufficiency of both lower extremities 05/21/2017   Past Surgical History:  Procedure Laterality Date   RENAL BIOPSY  1999   Patient Active Problem List   Diagnosis Date Noted   It band syndrome, left 04/21/2022   Pain in left leg 04/04/2022   Iron deficiency anemia 10/20/2021   Tremor 12/20/2015   Numbness and tingling in both hands 05/28/2015   Health care maintenance 12/23/2013   FSGS (focal segmental glomerulosclerosis) 07/02/2012   Hyperlipidemia 10/25/2006   Severe obesity (BMI >= 40) (HCC) 10/25/2006   Essential hypertension 10/25/2006     THERAPY DIAG:  Difficulty in walking, not elsewhere classified  Muscle weakness (generalized)  Stiffness of left hip, not elsewhere classified  Pain in left hip  PCP: Rudene Christians, DO   REFERRING PROVIDER: Valeria Batman, MD   REFERRING DIAG: [phone] (ICD-10-CM) - Pain of left hip   Rationale for Evaluation and Treatment Rehabilitation   ONSET DATE: Mar 28, 2022.  Started in April but got bad on 03/28/2022.   SUBJECTIVE:    SUBJECTIVE STATEMENT: Deborah Gonzales reports progress since day 1.  Mornings she is able to  function without the cane but by afternoon and evening more pain can be present and starts to affect function.   PERTINENT HISTORY: Hyperlipidemia, HTN, severe obesity, venous insufficiency B LE, B numbness and tingling U and LEs   PAIN:  Are you having pain? 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  No acute fractures femoral head is well  reduced with congruent joint spacing   PATIENT SURVEYS:  FOTO 57 (Goal 75 in 12 visits)   COGNITION:           Overall cognitive status: Within functional limits for tasks assessed                          SENSATION: No tingling or numbness     MUSCLE LENGTH: Hamstrings: Right 45 deg; Left 45 deg  LOWER EXTREMITY ROM:   Passive ROM Right eval Left eval  Hip flexion 75 65  Hip extension      Hip abduction      Hip adduction      Hip internal rotation 8 0  Hip external rotation 22 24  Knee flexion      Knee extension      Ankle dorsiflexion      Ankle plantarflexion      Ankle inversion      Ankle eversion       (Blank rows = not tested)   LOWER EXTREMITY MMT:   MMT Right 04/21/22 Left 04/21/22  Hip flexion  4+ 4+   Hip extension      Hip abduction  4  4  Hip adduction  4 4   Hip internal rotation      Hip external rotation      Knee flexion 5  5   Knee extension  5  4+  Ankle dorsiflexion      Ankle plantarflexion      Ankle inversion      Ankle eversion       (Blank rows = not tested)   LOWER EXTREMITY SPECIAL TESTS:  Hip  special tests: Ober's test: positive      GAIT: Comments: Deborah Gonzales is using a cane to decrease pressure on her L hip/IT Band.       TODAY'S TREATMENT: 05/01/2022 Figure 4 stretch 4X 20 seconds Modified Thomas Stretch 4X 20 seconds Clams (lie R, L side works) 15 reps for 3 seconds Supine Bridges 3 sec hold 2X 15 reps Reverse clam (lift foot and keep knees together) 15 reps for 3 seconds Side lie hip abduction (lie R, lift L) 2 sets of 10 for 3 seconds Alternating hip hike in door frame 2 sets of 5 for 3 seconds Sit to stand slow eccentrics 5X  Tandem balance 3X 30 seconds B   04/28/22 Nu step X 8 min L5  Modified Thomas stretch 3X 20 seconds on left Figure 4 stretch 3 X 30 seconds Bilat Supine ITB stretch with strap 3 X 20 seconds on Left Clam (lie R, L side works) 15 reps for 3 seconds Reverse clam (lift foot and keep knees together) 15 reps for 3 seconds Supine Bridges 3 sec hold 2X10 Standing hip abductions with red band around ankles X 15 bilat Standing hip extensions with red band around ankles  X15 bilat Tandem balance 30 sec X 2 bilat    04/21/22 Nu step X 8 min L5 LE only Modified Thomas stretch 2X 30 seconds B Figure 4 stretch on Lt 2X 30 seconds B Supine ITB stretch with strap 3 X 30 seconds in Left Clam (lie R, L side works) 15 reps for 3 seconds Reverse clam (lift foot and keep knees together) 15 reps for 3 seconds Supine Bridges 3 sec hold X15 Tandem balance 30 sec X 2 bilat Hip hike X 10 bilat  Manual therapy: Massage gun to Lt ITB, quads X 5 min     PATIENT EDUCATION:  Education details: Reviewed exam findings and HEP Person educated: Patient Education method: Explanation, Demonstration, Tactile cues, Verbal cues, and Handouts Education comprehension: verbalized understanding, returned demonstration, verbal cues required, tactile cues required, and needs further education     HOME EXERCISE PROGRAM: Access Code: [email or web address] URL:  [email or web address] Date: 05/01/2022 Prepared by: Pauletta Browns  Exercises - Supine Figure 4 Piriformis Stretch  - 2-3 x daily - 7 x weekly -  1 sets - 5 reps - 20 seconds hold - Modified Thomas Stretch  - 2-3 x daily - 7 x weekly - 1 sets - 5 reps - 20 seconds hold - Clam  - 2-3 x daily - 7 x weekly - 1-2 sets - 10 reps - 3 seconds hold - Sit to Stand with Armchair  - 2 x daily - 7 x weekly - 1 sets - 5 reps - Standing Hip Hiking  - 2 x daily - 7 x weekly - 2 sets - 5 reps - 3 seconds hold - Yoga Bridge  - 2 x daily - 7 x weekly - 1 sets - 10 reps - 5 seconds hold   ASSESSMENT:   CLINICAL IMPRESSION: Deborah Gonzales is making progress with her early physical therapy.  She is now walking without her cane in the mornings but notes fatigue and more soreness than pain at the end of the day.  Quadriceps, hip abductors and general LE strength will benefit from continued work to get Deborah Gonzales off the cane and back to her prior level of function.     OBJECTIVE IMPAIRMENTS Abnormal gait, decreased activity tolerance, decreased endurance, decreased knowledge of condition, difficulty walking, decreased ROM, decreased strength, increased edema, impaired perceived functional ability, impaired flexibility, obesity, and pain.    ACTIVITY LIMITATIONS carrying, bending, sitting, standing, squatting, sleeping, stairs, and locomotion level   PARTICIPATION LIMITATIONS: community activity and occupation   PERSONAL FACTORS Hyperlipidemia, HTN, severe obesity, venous insufficiency B LE, B numbness and tingling U and LEs are also affecting patient's functional outcome.    REHAB POTENTIAL: Good   CLINICAL DECISION MAKING: Stable/uncomplicated   EVALUATION COMPLEXITY: Low     GOALS: Goals reviewed with patient? Yes   SHORT TERM GOALS: Target date: 05/09/2022    Improve gait quality so Deborah Gonzales no longer needs a cane. Baseline: Needs cane 04/18/2022 Goal status: Only in the PM 05/01/2022      LONG TERM GOALS: Target date: 05/30/2022    Improve FOTO to 75 in 12 visits. Baseline: 57 Goal status: INITIAL   2.  Deborah Gonzales will report L hip/IT Band pain consistently 0-2/10 on the Numeric Pain Rating Scale. Baseline: 3-6/10 Goal status: INITIAL   3.  Improve B hip flexibility for hip flexors to 90 degrees and hip ER to 40 degrees. Baseline: (L/R): 65/75 and 24/22 respectively Goal status: INITIAL   4.  Deborah Gonzales will be able to return to work without a cane. Baseline: Not working and using a cane Goal status: INITIAL   5.  Deborah Gonzales will be independent with her long-term HEP at [place]. Baseline: Started 04/18/2022 Goal status: INITIAL     PLAN: PT FREQUENCY: 1-2x/week   PT DURATION: 6 weeks   PLANNED INTERVENTIONS: Therapeutic exercises, Therapeutic activity, Neuromuscular re-education, Balance training, Gait training, Patient/Family education, Joint mobilization, Stair training, Dry Needling, Cryotherapy, and Manual therapy   PLAN FOR NEXT SESSION: Continue functional progressions (balance, steps) along with continued LE strengthening (quadriceps and hip abductors emphasis).   Cherlyn Cushing, PT, MPT 05/01/2022, 2:16 PM

## 2022-05-03 ENCOUNTER — Ambulatory Visit (INDEPENDENT_AMBULATORY_CARE_PROVIDER_SITE_OTHER): Payer: PRIVATE HEALTH INSURANCE | Admitting: Physical Therapy

## 2022-05-03 ENCOUNTER — Encounter: Payer: Self-pay | Admitting: Physical Therapy

## 2022-05-03 DIAGNOSIS — M25652 Stiffness of left hip, not elsewhere classified: Secondary | ICD-10-CM

## 2022-05-03 DIAGNOSIS — R262 Difficulty in walking, not elsewhere classified: Secondary | ICD-10-CM | POA: Diagnosis not present

## 2022-05-03 DIAGNOSIS — M25552 Pain in left hip: Secondary | ICD-10-CM | POA: Diagnosis not present

## 2022-05-03 DIAGNOSIS — M6281 Muscle weakness (generalized): Secondary | ICD-10-CM

## 2022-05-03 NOTE — Therapy (Signed)
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 On appropriate therapy with ACE-i   Hyperlipidemia    Hypertension    Left ankle pain 06/26/2016   Pain in both upper arms 09/05/2016   Severe obesity (BMI >= 40) (HCC)    BMI 55   Venous insufficiency of both lower extremities 05/21/2017   Past Surgical History:  Procedure Laterality Date   RENAL BIOPSY  1999   Patient Active Problem List   Diagnosis Date Noted   It band syndrome, left 04/21/2022   Pain in left leg 04/04/2022   Iron deficiency anemia 10/20/2021   Tremor 12/20/2015   Numbness and tingling in both hands 05/28/2015   Health care maintenance 12/23/2013   FSGS (focal segmental glomerulosclerosis) 07/02/2012   Hyperlipidemia 10/25/2006   Severe obesity (BMI >= 40) (HCC) 10/25/2006   Essential hypertension 10/25/2006     THERAPY DIAG:  Difficulty in walking, not elsewhere classified  Muscle weakness (generalized)  Stiffness of left hip, not elsewhere classified  Pain in left hip  PCP: Rudene Christians, DO   REFERRING PROVIDER: Valeria Batman, MD   REFERRING DIAG: [phone] (ICD-10-CM) - Pain of left hip   Rationale for Evaluation and Treatment Rehabilitation   ONSET DATE: Mar 28, 2022.  Started in April but got bad on 03/28/2022.   SUBJECTIVE:    SUBJECTIVE STATEMENT: Deborah Gonzales reports the pain is better in her hip, has some pain in  her left thigh today. She did not need to use her cane yesterday due to feeling better   PERTINENT HISTORY: Hyperlipidemia, HTN, severe obesity, venous insufficiency B LE, B numbness and tingling U and LEs   PAIN:  Are you having pain? 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  No acute fractures femoral head is well  reduced with congruent joint spacing   PATIENT SURVEYS:  FOTO 57 (Goal 75 in 12 visits)   COGNITION:           Overall cognitive status: Within functional limits for tasks assessed                          SENSATION: No tingling or numbness     MUSCLE LENGTH: Hamstrings: Right 45 deg; Left 45 deg  LOWER EXTREMITY ROM:   Passive ROM Right eval Left eval  Hip flexion 75 65  Hip extension      Hip abduction      Hip adduction      Hip internal rotation 8 0  Hip external rotation 22 24  Knee flexion      Knee extension      Ankle dorsiflexion      Ankle plantarflexion      Ankle inversion      Ankle eversion       (Blank rows = not tested)   LOWER EXTREMITY MMT:   MMT Right 04/21/22 Left 04/21/22  Hip flexion  4+ 4+   Hip extension      Hip abduction  4  4  Hip adduction  4 4   Hip internal rotation      Hip external rotation      Knee flexion 5  5   Knee extension  5  4+  Ankle dorsiflexion      Ankle plantarflexion      Ankle inversion      Ankle eversion       (Blank rows = not tested)   LOWER EXTREMITY SPECIAL TESTS:  Hip special tests: Ober's  test: positive      GAIT: Comments: Deborah Gonzales is using a cane to decrease pressure on her L hip/IT Band.       TODAY'S TREATMENT: 05/03/22 Nu step X 8 min L6 Modified Thomas stretch 3X 20 seconds on left Figure 4 stretch 3 X 30 seconds Bilat Clam (lie R, L side works) 15 reps for 3 seconds Reverse clam (lift foot and keep knees together) 15 reps for 3 seconds Supine Bridges 3 sec hold X15 Standing hip hike 15 reps bilat Standing hip abductions with green band around ankles X 15 bilat Standing hip extensions with green band around ankles  X15 bilat Sidestepping with green band around ankles 3 round trips in bars without UE support Tandem balance 30 sec X 2 bilat Sit to stand slow eccentrics 5X  05/01/2022 Figure 4 stretch 4X 20 seconds Modified Thomas Stretch 4X 20 seconds Clams (lie R, L side works) 15 reps for 3 seconds Supine Bridges 3 sec hold 2X 15 reps Reverse clam (lift foot and keep knees together) 15 reps for 3 seconds Side lie hip abduction (lie R, lift L) 2 sets of 10 for 3 seconds Alternating hip hike in door frame 2 sets of 5 for 3 seconds Sit to stand slow eccentrics 5X  Tandem balance 3X 30 seconds B     PATIENT EDUCATION:  Education details: Reviewed exam findings and HEP Person educated: Patient Education method: Explanation, Demonstration, Tactile cues, Verbal cues, and Handouts Education comprehension: verbalized understanding, returned demonstration, verbal cues required, tactile cues required, and needs further education     HOME EXERCISE PROGRAM: Access Code: [email or web address] URL: [email or web address] Date: 05/01/2022 Prepared by: Pauletta Browns  Exercises - Supine Figure 4 Piriformis Stretch  - 2-3 x daily - 7 x weekly - 1 sets - 5 reps - 20 seconds hold - Modified Thomas Stretch  - 2-3 x daily - 7 x weekly - 1 sets - 5 reps - 20 seconds hold - Clam  - 2-3 x daily - 7 x weekly - 1-2 sets - 10 reps - 3 seconds hold - Sit to Stand with  Armchair  - 2 x daily - 7 x weekly - 1 sets - 5 reps - Standing Hip Hiking  -  2 x daily - 7 x weekly - 2 sets - 5 reps - 3 seconds hold - Yoga Bridge  - 2 x daily - 7 x weekly - 1 sets - 10 reps - 5 seconds hold   ASSESSMENT:   CLINICAL IMPRESSION: She had good tolerance to strength progressions today and has been able to wean off the cane for the most part due to less pain and difficulty with ambulation now.      OBJECTIVE IMPAIRMENTS Abnormal gait, decreased activity tolerance, decreased endurance, decreased knowledge of condition, difficulty walking, decreased ROM, decreased strength, increased edema, impaired perceived functional ability, impaired flexibility, obesity, and pain.    ACTIVITY LIMITATIONS carrying, bending, sitting, standing, squatting, sleeping, stairs, and locomotion level   PARTICIPATION LIMITATIONS: community activity and occupation   PERSONAL FACTORS Hyperlipidemia, HTN, severe obesity, venous insufficiency B LE, B numbness and tingling U and LEs are also affecting patient's functional outcome.    REHAB POTENTIAL: Good   CLINICAL DECISION MAKING: Stable/uncomplicated   EVALUATION COMPLEXITY: Low     GOALS: Goals reviewed with patient? Yes   SHORT TERM GOALS: Target date: 05/09/2022    Improve gait quality so Dashley no longer needs a cane. Baseline: Needs cane 04/18/2022 Goal status: Only in the PM 05/01/2022     LONG TERM GOALS: Target date: 05/30/2022    Improve FOTO to 75 in 12 visits. Baseline: 57 Goal status: INITIAL   2.  Maicie will report L hip/IT Band pain consistently 0-2/10 on the Numeric Pain Rating Scale. Baseline: 3-6/10 Goal status: INITIAL   3.  Improve B hip flexibility for hip flexors to 90 degrees and hip ER to 40 degrees. Baseline: (L/R): 65/75 and 24/22 respectively Goal status: INITIAL   4.  Nayda will be able to return to work without a cane. Baseline: Not working and using a cane Goal status: INITIAL   5.   Leeah will be independent with her long-term HEP at [place]. Baseline: Started 04/18/2022 Goal status: INITIAL     PLAN: PT FREQUENCY: 1-2x/week   PT DURATION: 6 weeks   PLANNED INTERVENTIONS: Therapeutic exercises, Therapeutic activity, Neuromuscular re-education, Balance training, Gait training, Patient/Family education, Joint mobilization, Stair training, Dry Needling, Cryotherapy, and Manual therapy   PLAN FOR NEXT SESSION: Continue functional progressions (balance, steps) along with continued LE strengthening (quadriceps and hip abductors emphasis).   April Manson, PT, DPT 05/03/2022, 10:04 AM

## 2022-05-08 ENCOUNTER — Ambulatory Visit (INDEPENDENT_AMBULATORY_CARE_PROVIDER_SITE_OTHER): Payer: PRIVATE HEALTH INSURANCE | Admitting: Physician Assistant

## 2022-05-08 ENCOUNTER — Encounter: Payer: Self-pay | Admitting: Orthopaedic Surgery

## 2022-05-08 ENCOUNTER — Encounter: Payer: Self-pay | Admitting: Physical Therapy

## 2022-05-08 ENCOUNTER — Ambulatory Visit (INDEPENDENT_AMBULATORY_CARE_PROVIDER_SITE_OTHER): Payer: PRIVATE HEALTH INSURANCE | Admitting: Physical Therapy

## 2022-05-08 DIAGNOSIS — M25552 Pain in left hip: Secondary | ICD-10-CM

## 2022-05-08 DIAGNOSIS — M25652 Stiffness of left hip, not elsewhere classified: Secondary | ICD-10-CM | POA: Diagnosis not present

## 2022-05-08 DIAGNOSIS — R262 Difficulty in walking, not elsewhere classified: Secondary | ICD-10-CM

## 2022-05-08 DIAGNOSIS — M6281 Muscle weakness (generalized): Secondary | ICD-10-CM | POA: Diagnosis not present

## 2022-05-08 DIAGNOSIS — M7632 Iliotibial band syndrome, left leg: Secondary | ICD-10-CM

## 2022-05-08 NOTE — Progress Notes (Signed)
HPI: Deborah Gonzales returns today follow-up of her left hip pain.  She states her pain is much better.  She states pain is 3-4 out of 10 pain at worst.  Feels therapy is definitely helped.  She is having no numbness tingling down the leg.  She has been to 5 visits with therapy so far and has a visit today.  States she has a good home exercise program as taught by therapy for her IT band syndrome.  Review of systems see HPI otherwise negative  Physical exam: General well-developed well-nourished female no acute distress mood and affect appropriate ambulates with a cane.  Left hip: Good range of motion of the hip without pain.  Minimal tenderness over the trochanteric region down the IT band.  Left knee good range of motion without pain patellofemoral crepitus present.  Impression: Left hip IT band syndrome  Plan: She will continue to work with therapy discharge to home exercise program.  Follow-up with [place] if pain persist or becomes worse.  Questions encouraged and answered at length

## 2022-05-11 ENCOUNTER — Ambulatory Visit (INDEPENDENT_AMBULATORY_CARE_PROVIDER_SITE_OTHER): Payer: PRIVATE HEALTH INSURANCE | Admitting: Rehabilitative and Restorative Service Providers"

## 2022-05-11 ENCOUNTER — Encounter: Payer: Self-pay | Admitting: Rehabilitative and Restorative Service Providers"

## 2022-05-11 DIAGNOSIS — R262 Difficulty in walking, not elsewhere classified: Secondary | ICD-10-CM | POA: Diagnosis not present

## 2022-05-11 DIAGNOSIS — M25652 Stiffness of left hip, not elsewhere classified: Secondary | ICD-10-CM

## 2022-05-11 DIAGNOSIS — M6281 Muscle weakness (generalized): Secondary | ICD-10-CM | POA: Diagnosis not present

## 2022-05-11 DIAGNOSIS — M25552 Pain in left hip: Secondary | ICD-10-CM | POA: Diagnosis not present

## 2022-05-11 NOTE — Therapy (Signed)
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 On appropriate therapy with ACE-i   Hyperlipidemia    Hypertension    Left ankle pain 06/26/2016   Pain in both upper arms 09/05/2016   Severe obesity (BMI >= 40) (HCC)    BMI 55   Venous insufficiency of both lower extremities 05/21/2017   Past Surgical History:  Procedure Laterality Date   RENAL BIOPSY  1999   Patient Active Problem List   Diagnosis Date Noted   It band syndrome, left 04/21/2022   Pain in left leg 04/04/2022   Iron deficiency anemia 10/20/2021   Tremor 12/20/2015   Numbness and tingling in both hands 05/28/2015   Health care maintenance 12/23/2013   FSGS (focal segmental glomerulosclerosis) 07/02/2012   Hyperlipidemia 10/25/2006   Severe obesity (BMI >= 40) (HCC) 10/25/2006   Essential hypertension 10/25/2006     THERAPY DIAG:  Difficulty in walking, not elsewhere classified  Muscle weakness (generalized)  Stiffness of left hip, not elsewhere classified  Pain in left hip  PCP: Rudene Christians, DO   REFERRING PROVIDER: Valeria Batman, MD   REFERRING DIAG: [phone] (ICD-10-CM) - Pain of left hip   Rationale for Evaluation and Treatment Rehabilitation   ONSET DATE: Mar 28, 2022.  Started in April but got bad on 03/28/2022.   SUBJECTIVE:    SUBJECTIVE STATEMENT: Erza was able to walk for 30 minutes yesterday without the  cane.  She did need to take frequent breaks.     PERTINENT HISTORY: Hyperlipidemia, HTN, severe obesity, venous insufficiency B LE, B numbness and tingling U and LEs   PAIN:  Are you having pain? 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  No acute fractures femoral head is well  reduced with congruent joint spacing   PATIENT SURVEYS:  FOTO 57 (Goal 75 in 12 visits)   COGNITION:           Overall cognitive status: Within functional limits for tasks assessed                          SENSATION: No tingling or numbness     MUSCLE LENGTH: Hamstrings: Right 45 deg;  Left 45 deg   LOWER EXTREMITY ROM:   Passive ROM Right eval Left eval  Hip flexion 75 65  Hip extension      Hip abduction      Hip adduction      Hip internal rotation 8 0  Hip external rotation 22 24  Knee flexion      Knee extension      Ankle dorsiflexion      Ankle plantarflexion      Ankle inversion      Ankle eversion       (Blank rows = not tested)   LOWER EXTREMITY MMT:   MMT Right 04/21/22 Left 04/21/22  Hip flexion  4+ 4+   Hip extension      Hip abduction  4  4  Hip adduction  4 4   Hip internal rotation      Hip external rotation      Knee flexion 5  5   Knee extension  5  4+  Ankle dorsiflexion      Ankle plantarflexion      Ankle inversion      Ankle eversion       (Blank rows = not tested)   LOWER EXTREMITY SPECIAL TESTS:  Hip special  tests: Ober's test: positive      GAIT: Comments: Itzel is using a cane to decrease pressure on her L hip/IT Band.       TODAY'S TREATMENT: 05/11/2022 Figure 4 stretch 4X 20 seconds Modified Thomas Stretch 4X 20 seconds Clams (lie R, L side works) 20 reps for 5 seconds Supine Bridges 5 sec hold 20 reps Reverse clam (lift foot and keep knees together) 20 reps for 5 seconds Side lie hip abduction (lie R, lift L) 2 sets of 10 for 3 seconds Alternating hip hike in door frame 2 sets of 5 for 3 seconds  Functional Activities: Leg Press (sit to stand, stairs, curbs) DL 2 sets of 10 slow eccentrics 125# and SL 10X each side 62# Sit to stand slow eccentrics 5X   05/08/22 Nu step X 8 min L6 Standing hip hike 15 reps bilat Sidestepping with green band around ankles 3 round trips at counter top Retro walking with green band around ankles 3 round trips at counter top Tandem walk 3 round trips at counter top Leg press DL 100# 2X15, then SL 50# 2X15 bilat Modified Thomas stretch 3X30 seconds bilat Figure 4 stretch 3 X 30 seconds Bilat Clam (lie R, L side works) 15 reps for 5 seconds Supine Bridges 5 sec hold X15   05/03/22 Nu step X 8 min L6 Modified Thomas stretch 3X 20 seconds on left Figure 4 stretch 3 X 30 seconds Bilat Clam (lie R, L side works) 15 reps for 3 seconds Reverse clam (lift foot and keep knees together) 15 reps for 3 seconds Supine Bridges 3 sec hold X15 Standing hip hike 15 reps bilat Standing hip abductions with green band around ankles X 15 bilat Standing hip extensions with green band around ankles  X15 bilat Sidestepping with green band around ankles 3 round trips in bars without UE support Tandem balance 30 sec X 2 bilat Sit to stand slow eccentrics 5X    PATIENT EDUCATION:  Education details: Reviewed exam findings and HEP Person educated: Patient Education method: Explanation, Demonstration, Tactile cues, Verbal cues, and Handouts Education  comprehension: verbalized understanding, returned demonstration, verbal cues required, tactile cues required, and needs further education     HOME EXERCISE  PROGRAM: Access Code: [email or web address] URL: [email or web address] Date: 05/01/2022 Prepared by: Pauletta Browns  Exercises - Supine Figure 4 Piriformis Stretch  - 2-3 x daily - 7 x weekly - 1 sets - 5 reps - 20 seconds hold - Modified Thomas Stretch  - 2-3 x daily - 7 x weekly - 1 sets - 5 reps - 20 seconds hold - Clam  - 2-3 x daily - 7 x weekly - 1-2 sets - 10 reps - 3 seconds hold - Sit to Stand with Armchair  - 2 x daily - 7 x weekly - 1 sets - 5 reps - Standing Hip Hiking  - 2 x daily - 7 x weekly - 2 sets - 5 reps - 3 seconds hold - Yoga Bridge  - 2 x daily - 7 x weekly - 1 sets - 10 reps - 5 seconds hold   ASSESSMENT:   CLINICAL IMPRESSION: Aashi is making great progress with her supervised PT.  Pain and WB function are improving.  She is appropriate for reassessment next visit to objectively document progress, get a FOTO update and determine the need for continued supervised PT vs independent rehabilitation.     OBJECTIVE IMPAIRMENTS Abnormal gait, decreased activity tolerance, decreased endurance, decreased knowledge of condition, difficulty walking, decreased ROM, decreased strength, increased edema, impaired perceived functional ability, impaired flexibility, obesity, and pain.    ACTIVITY LIMITATIONS carrying, bending, sitting, standing, squatting, sleeping, stairs, and locomotion level   PARTICIPATION LIMITATIONS: community activity and occupation   PERSONAL FACTORS Hyperlipidemia, HTN, severe obesity, venous insufficiency B LE, B numbness and tingling U and LEs are also affecting patient's functional outcome.    REHAB POTENTIAL: Good   CLINICAL DECISION MAKING: Stable/uncomplicated   EVALUATION COMPLEXITY: Low     GOALS: Goals reviewed with patient? Yes   SHORT TERM GOALS: Target date: 05/09/2022     Improve gait quality so Jimmi no longer needs a cane. Baseline: Needs cane 04/18/2022 Goal status: Only with prolonged WB 05/11/2022     LONG TERM GOALS: Target date: 05/30/2022    Improve FOTO to 75 in 12 visits. Baseline: 57 Goal status:  ongoing   2.  Cadey will report L hip/IT Band pain consistently 0-2/10 on the Numeric Pain Rating Scale. Baseline: 3-6/10 Goal status:  On Going (2-4/10) 05/11/2022   3.  Improve B hip flexibility for hip flexors to 90 degrees and hip ER to 40 degrees. Baseline: (L/R): 65/75 and 24/22 respectively Goal status: Initial   4.  Lerlene will be able to return to work without a cane. Baseline: Not working and using a cane Goal status: On Going   5.  Miria will be independent with her long-term HEP at [place]. Baseline: Started 04/18/2022 Goal status: On Going     PLAN: PT FREQUENCY: 1-2x/week   PT DURATION: 6 weeks   PLANNED INTERVENTIONS: Therapeutic exercises, Therapeutic activity, Neuromuscular re-education, Balance training, Gait training, Patient/Family education, Joint mobilization, Stair training, Dry Needling, Cryotherapy, and Manual therapy   PLAN FOR NEXT SESSION: Reassessment/progress note with FOTO.   Cherlyn Cushing, PT, MPT 05/11/2022, 12:11 PM

## 2022-05-24 ENCOUNTER — Encounter: Payer: Self-pay | Admitting: Rehabilitative and Restorative Service Providers"

## 2022-05-24 ENCOUNTER — Ambulatory Visit (INDEPENDENT_AMBULATORY_CARE_PROVIDER_SITE_OTHER): Payer: PRIVATE HEALTH INSURANCE | Admitting: Rehabilitative and Restorative Service Providers"

## 2022-05-24 DIAGNOSIS — M6281 Muscle weakness (generalized): Secondary | ICD-10-CM | POA: Diagnosis not present

## 2022-05-24 DIAGNOSIS — R262 Difficulty in walking, not elsewhere classified: Secondary | ICD-10-CM

## 2022-05-24 DIAGNOSIS — M25552 Pain in left hip: Secondary | ICD-10-CM

## 2022-05-24 DIAGNOSIS — M25652 Stiffness of left hip, not elsewhere classified: Secondary | ICD-10-CM | POA: Diagnosis not present

## 2022-05-24 NOTE — Therapy (Signed)
OUTPATIENT PHYSICAL THERAPY TREATMENT/PROGRESS NOTE   Patient Name: Deborah Gonzales MRN: [id number] DOB:Jan 22, 1981, 41 y.o., female Today's Date: 05/24/2022  END OF SESSION:   PT End of Session - 05/24/22 1356     Visit Number 8    Number of Visits 12    PT Start Time 1015    PT Stop Time 1100    PT Time Calculation (min) 45 min    Activity Tolerance Patient tolerated treatment well;No increased pain    Behavior During Therapy [facility] for tasks assessed/performed             Progress Note Reporting Period 04/18/2022 to 05/24/2022  See note below for Objective Data and Assessment of Progress/Goals.       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 On appropriate therapy with ACE-i   Hyperlipidemia    Hypertension    Left ankle pain 06/26/2016   Pain in both upper arms 09/05/2016   Severe obesity (BMI >= 40) (HCC)    BMI 55   Venous insufficiency of both lower extremities 05/21/2017   Past Surgical History:  Procedure Laterality Date   RENAL BIOPSY  1999   Patient Active Problem List   Diagnosis Date Noted   It band syndrome, left 04/21/2022   Pain in left leg 04/04/2022   Iron deficiency anemia 10/20/2021   Tremor 12/20/2015   Numbness and tingling in both hands 05/28/2015   Health care maintenance 12/23/2013   FSGS (focal segmental glomerulosclerosis) 07/02/2012   Hyperlipidemia 10/25/2006   Severe obesity (BMI >= 40) (HCC) 10/25/2006   Essential hypertension 10/25/2006     THERAPY DIAG:  Difficulty in walking, not elsewhere classified  Muscle weakness (generalized)  Stiffness of left hip, not elsewhere classified  Pain in left hip  PCP: Rudene Christians, DO   REFERRING PROVIDER: Valeria Batman, MD   REFERRING DIAG: [phone] (ICD-10-CM) - Pain of left hip   Rationale for Evaluation and Treatment Rehabilitation   ONSET DATE: Mar 28, 2022.  Started in  April but got bad on 03/28/2022.   SUBJECTIVE:    SUBJECTIVE STATEMENT: Hawraa presents today without an assistive device.  She has been using the cane for walking > 10-15 minutes.  Overall progress is noted since starting PT.     PERTINENT HISTORY: Hyperlipidemia, HTN, severe obesity, venous insufficiency B LE, B numbness and tingling U and LEs   PAIN:  Are you having pain? 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  No acute fractures femoral head is well  reduced with congruent joint spacing   PATIENT SURVEYS:  05/24/2022 FOTO 63 (Goal 75) 04/18/2022 FOTO 57 (Goal 75 in 12 visits)   COGNITION:           Overall cognitive status: Within  functional limits for tasks assessed                          SENSATION: No tingling or numbness     MUSCLE LENGTH: 05/24/2022 Hamstrings Right 50 deg; Left 60 deg Eval Hamstrings: Right 45 deg; Left 45 deg   LOWER EXTREMITY ROM:   Passive ROM Right eval Left eval L/R in degrees 05/24/2022  Hip flexion 75 65 75/85  Hip extension       Hip abduction       Hip adduction       Hip internal rotation 8 0 7/8  Hip external rotation 22 24 25/31  Knee flexion       Knee extension       Ankle dorsiflexion       Ankle plantarflexion       Ankle inversion       Ankle eversion        (Blank rows = not tested)   LOWER EXTREMITY MMT:   MMT  Right 04/21/22 Left 04/21/22 L/R 05/24/2022  Hip flexion  4+ 4+    Hip extension       Hip abduction  4  4 4+/5 B  Hip adduction  4 4    Hip internal rotation       Hip external rotation       Knee flexion 5  5    Knee extension  5  4+ 4/4+  Ankle dorsiflexion       Ankle plantarflexion       Ankle inversion       Ankle eversion        (Blank rows = not tested)   LOWER EXTREMITY SPECIAL TESTS:  Hip special tests: Ober's test: positive      GAIT: 05/24/2022 Dechelle is using the cane less frequently  04/18/2022  Comments: Demetrice is using a cane to decrease pressure on her L hip/IT Band.       TODAY'S TREATMENT: 05/24/2022 Figure 4 stretch 4X 20 seconds Modified Thomas Stretch 4X 20 seconds Clams (lie R, L side works) 10 reps for 5 seconds with Blue theraband resistance Supine Bridges 5 sec hold 10 reps Reverse clam (lift foot and keep knees together) 10 reps for 5 seconds Side lie hip abduction (lie R/L, lift L/R) 10 for 3 seconds Alternating hip hike in door frame 2 sets of 5 for 3 seconds Seated straight leg raises 2 sets of 5 for 3 seconds  Functional Activities: Leg Press (sit to stand, stairs, curbs) DL 2 sets of 10 slow eccentrics 125# and SL 10X each side 62# Sit to stand slow eccentrics 5X   05/11/2022 Figure 4 stretch 4X 20 seconds Modified Thomas Stretch 4X 20 seconds Clams (lie R, L side works) 20 reps for 5 seconds Supine Bridges 5 sec hold 20 reps Reverse clam (lift foot and keep knees together) 20 reps for 5 seconds Side lie hip abduction (lie R, lift L) 2 sets of 10 for 3 seconds Alternating hip hike in door frame 2 sets of 5 for 3 seconds   Functional Activities: Leg Press (sit to stand, stairs, curbs) DL 2 sets of 10 slow eccentrics 125# and SL 10X each side 62# Sit to stand slow eccentrics 5X   05/08/22 Nu step X 8 min L6 Standing hip hike 15 reps bilat Sidestepping with green band around ankles 3 round trips at counter top Retro walking with  green band around ankles  3 round trips at counter top Tandem walk 3 round trips at counter top Leg press DL 625# 6L89, then SL 37# 2X15 bilat Modified Thomas stretch 3X30 seconds bilat Figure 4 stretch 3 X 30 seconds Bilat Clam (lie R, L side works) 15 reps for 5 seconds Supine Bridges 5 sec hold X15     PATIENT EDUCATION:  Education details: Reviewed exam findings and HEP Person educated: Patient Education method: Programmer, multimedia, Demonstration, Actor cues, Verbal cues, and Handouts Education comprehension: verbalized understanding, returned demonstration, verbal cues required, tactile cues required, and needs further education     HOME EXERCISE PROGRAM: Access Code: [email or web address] URL: [email or web address] Date: 05/24/2022 Prepared by: Pauletta Browns  Exercises - Supine Figure 4 Piriformis Stretch  - 2-3 x daily - 7 x weekly - 1 sets - 5 reps - 20 seconds hold - Sit to Stand with Armchair  - 2 x daily - 7 x weekly - 1 sets - 5 reps - Standing Hip Hiking  - 2 x daily - 1 x weekly - 2 sets - 10 reps - 3 seconds hold - Yoga Bridge  - 2 x daily - 1 x weekly - 1 sets - 10 reps - 5 seconds hold - Clamshell  - 1 x daily - 7 x weekly - 2-3 sets - 10 reps - 5 seconds hold - Prone Hip Extension  - 1 x daily - 7 x weekly - 2-3 sets - 10 reps - 3 seconds hold - Sidelying Hip Abduction  - 1 x daily - 7 x weekly - 2 sets - 10 reps - 5 seconds hold - Seated Straight Leg Raise  - 2-3 x daily - 7 x weekly - 2-3 sets - 5 reps - 3 seconds hold   ASSESSMENT:   CLINICAL IMPRESSION: Braylei is making great overall progress with her supervised PT.  Pain and WB function are improving and she is only susing a cane for longer walks outside the home (was full-time).  She has lingering LE weakness (particularly quadriceps) that is being addressed.  She was offered the option of continued supervised PT vs independent rehabilitation.  She would like to continue another 2-4 visits over the next month  to see if she can return to 30 minute walks without the cane or L LE pain.     OBJECTIVE IMPAIRMENTS Abnormal gait, decreased activity tolerance, decreased endurance, decreased knowledge of condition, difficulty walking, decreased ROM, decreased strength, increased edema, impaired perceived functional ability, impaired flexibility, obesity, and pain.    ACTIVITY LIMITATIONS carrying, bending, sitting, standing, squatting, sleeping, stairs, and locomotion level   PARTICIPATION LIMITATIONS: community activity and occupation   PERSONAL FACTORS Hyperlipidemia, HTN, severe obesity, venous insufficiency B LE, B numbness and tingling U and LEs are also affecting patient's functional outcome.    REHAB POTENTIAL: Good   CLINICAL DECISION MAKING: Stable/uncomplicated   EVALUATION COMPLEXITY: Low     GOALS: Goals reviewed with patient? Yes   SHORT TERM GOALS: Target date: 05/09/2022    Improve gait quality so Kidada no longer needs a cane. Baseline: Needs cane 04/18/2022 Goal status: Only with prolonged WB 05/24/2022     LONG TERM GOALS: Target date: 05/30/2022    Improve FOTO to 75 in 12 visits. Baseline: 57 Goal status:  63 On Going 05/24/2022   2.  Mailee will report L hip/IT Band pain consistently 0-2/10 on the Numeric Pain Rating Scale. Baseline: 3-6/10 Goal status:  On Going (2-3/10) 05/24/2022   3.  Improve B hip  flexibility for hip flexors to 90 degrees and hip ER to 40 degrees. Baseline: (L/R): 65/75 and 24/22 respectively Goal status: Improved from evaluation 05/24/2022   4.  Jasey will be able to return to work without a cane. Baseline: Not working and using a cane Goal status: On Going Using cane less 05/24/2022   5.  Maisa will be independent with her long-term HEP at [place]. Baseline: Started 04/18/2022 Goal status: On Going 05/24/2022     PLAN: PT FREQUENCY: 1X/week   PT DURATION: 4-5 weeks   PLANNED INTERVENTIONS: Therapeutic exercises, Therapeutic activity,  Neuromuscular re-education, Balance training, Gait training, Patient/Family education, Joint mobilization, Stair training, Dry Needling, Cryotherapy, and Manual therapy   PLAN FOR NEXT SESSION: Strength progressions PRN for hip abductors and quadriceps, functional challenges   Cherlyn Cushing, PT, MPT 05/24/2022, 5:13 PM

## 2022-05-30 ENCOUNTER — Encounter: Payer: Self-pay | Admitting: Internal Medicine

## 2022-05-30 ENCOUNTER — Telehealth: Payer: Self-pay

## 2022-05-30 NOTE — Telephone Encounter (Signed)
Requesting a letter for work, please call pt back.  

## 2022-05-30 NOTE — Telephone Encounter (Signed)
RTC to patient to come in on tomorrow to pick up letter to return to work.

## 2022-05-30 NOTE — Telephone Encounter (Signed)
Patients needs letter to return to work.  Has last PT appointment on 06/22/2022.  Will pick up letter.

## 2022-05-31 ENCOUNTER — Ambulatory Visit (INDEPENDENT_AMBULATORY_CARE_PROVIDER_SITE_OTHER): Payer: PRIVATE HEALTH INSURANCE | Admitting: Rehabilitative and Restorative Service Providers"

## 2022-05-31 ENCOUNTER — Encounter: Payer: Self-pay | Admitting: Rehabilitative and Restorative Service Providers"

## 2022-05-31 DIAGNOSIS — R262 Difficulty in walking, not elsewhere classified: Secondary | ICD-10-CM | POA: Diagnosis not present

## 2022-05-31 DIAGNOSIS — M25552 Pain in left hip: Secondary | ICD-10-CM

## 2022-05-31 DIAGNOSIS — M25652 Stiffness of left hip, not elsewhere classified: Secondary | ICD-10-CM

## 2022-05-31 DIAGNOSIS — M6281 Muscle weakness (generalized): Secondary | ICD-10-CM | POA: Diagnosis not present

## 2022-05-31 NOTE — Therapy (Signed)
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 On appropriate therapy with ACE-i   Hyperlipidemia    Hypertension    Left ankle pain 06/26/2016   Pain in both upper arms 09/05/2016   Severe obesity (BMI >= 40) (HCC)    BMI 55   Venous insufficiency of both lower extremities 05/21/2017   Past Surgical History:  Procedure Laterality Date   RENAL BIOPSY  1999   Patient Active Problem List   Diagnosis Date Noted   It band syndrome, left 04/21/2022   Pain in left leg 04/04/2022   Iron deficiency anemia 10/20/2021   Tremor 12/20/2015   Numbness and tingling in both hands 05/28/2015   Health care maintenance 12/23/2013   FSGS (focal segmental glomerulosclerosis) 07/02/2012   Hyperlipidemia 10/25/2006   Severe obesity (BMI >= 40) (HCC) 10/25/2006   Essential hypertension 10/25/2006     THERAPY DIAG:  Difficulty in walking, not elsewhere classified  Muscle weakness (generalized)  Stiffness of left hip, not elsewhere classified  Pain in left hip  PCP: Rudene Christians, DO   REFERRING PROVIDER: Valeria Batman, MD   REFERRING DIAG: [phone] (ICD-10-CM) - Pain of left hip   Rationale for Evaluation and Treatment Rehabilitation   ONSET DATE: Mar 28, 2022.  Started in April but got bad on 03/28/2022.   SUBJECTIVE:    SUBJECTIVE STATEMENT: Deborah Gonzales presents today without an  assistive device.  She has not used the cane in over a week.  She was able to walk > 30 minutes without the cane but needed ice afterwards.      PERTINENT HISTORY: Hyperlipidemia, HTN, severe obesity, venous insufficiency B LE, B numbness and tingling U and LEs   PAIN:  Are you having pain? 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  No acute fractures femoral head is well  reduced with congruent joint spacing   PATIENT SURVEYS:  05/24/2022 FOTO 63 (Goal 75) 04/18/2022 FOTO 57 (Goal 75 in 12 visits)   COGNITION:           Overall cognitive  status: Within functional limits for tasks assessed                          SENSATION: No tingling or numbness     MUSCLE LENGTH: 05/24/2022 Hamstrings Right 50 deg; Left 60 deg Eval Hamstrings: Right 45 deg; Left 45 deg   LOWER EXTREMITY ROM:   Passive ROM Right eval Left eval L/R in degrees 05/24/2022  Hip flexion 75 65 75/85  Hip extension       Hip abduction       Hip adduction       Hip internal rotation 8 0 7/8  Hip external rotation 22 24 25/31  Knee flexion       Knee extension       Ankle dorsiflexion       Ankle plantarflexion       Ankle inversion       Ankle eversion        (Blank rows = not tested)   LOWER EXTREMITY MMT:   MMT Right 04/21/22  Left 04/21/22 L/R 05/24/2022  Hip flexion  4+ 4+    Hip extension       Hip abduction  4  4 4+/5 B  Hip adduction  4 4    Hip internal rotation       Hip external rotation       Knee flexion 5  5    Knee extension  5  4+ 4/4+  Ankle dorsiflexion       Ankle plantarflexion       Ankle inversion       Ankle eversion        (Blank rows = not tested)   LOWER EXTREMITY SPECIAL TESTS:  Hip special tests: Ober's test: positive      GAIT: 05/24/2022 Deborah Gonzales is using the cane less frequently  04/18/2022  Comments: Deborah Gonzales is using a cane to decrease pressure on her L hip/IT Band.       TODAY'S TREATMENT: 05/31/2022 Figure 4 stretch 4X 20 seconds Modified Thomas Stretch 4X 20 seconds Clams (lie R, L side works) 15 reps for 5 seconds with Blue theraband resistance Supine Bridges 5 sec hold 15 reps Reverse clam (lift foot and keep knees together) 15 reps for 5 seconds Side lie hip abduction (lie R/L) 2 sets of 10 for 3 seconds (needs PT help to maintain 1/4 turn from stomach) Alternating hip hike in door frame 2 sets of 5 for 3 seconds Seated straight leg raises 3 sets of 5 for 3 seconds with 1.5#  Functional Activities: (for stairs, curbs, sit to stand) Leg Press (sit to stand, stairs, curbs) DL 2 sets of 10 slow eccentrics 137# and SL 10X each side 62# Sit to stand slow eccentrics 10X  Step-up and over with slow eccentrics 4 and 6 inch step 10X each   05/24/2022 Figure 4 stretch 4X 20 seconds Modified Thomas Stretch 4X 20 seconds Clams (lie R, L side works) 10 reps for 5 seconds with Blue theraband resistance Supine Bridges 5 sec hold 10 reps Reverse clam (lift foot and keep knees together) 10 reps for 5 seconds Side lie hip abduction (lie R/L, lift L/R) 10 for 3 seconds Alternating hip hike in door frame 2 sets of 5 for 3 seconds Seated straight leg raises 2 sets of 5 for 3 seconds  Functional Activities: Leg Press (sit to stand, stairs, curbs) DL 2 sets of 10 slow  eccentrics 125# and  SL 10X each side 62# Sit to stand slow eccentrics 5X   05/11/2022 Figure 4 stretch 4X 20 seconds Modified Thomas Stretch 4X 20 seconds Clams (lie R, L side works) 20 reps for 5 seconds Supine Bridges 5 sec hold 20 reps Reverse clam (lift foot and keep knees together) 20 reps for 5 seconds Side lie hip abduction (lie R, lift L) 2 sets of 10 for 3 seconds Alternating hip hike in door frame 2 sets of 5 for 3 seconds   Functional Activities: Leg Press (sit to stand, stairs, curbs) DL 2 sets of 10 slow eccentrics 125# and SL 10X each side 62# Sit to stand slow eccentrics 5X    PATIENT EDUCATION:  Education details: Reviewed exam findings and HEP Person educated: Patient Education method: Explanation, Demonstration, Tactile cues, Verbal cues, and Handouts Education comprehension: verbalized understanding, returned demonstration, verbal cues required, tactile cues required, and needs further education     HOME EXERCISE PROGRAM: Access Code: [email or web address] URL: [email or web address] Date: 05/24/2022 Prepared by: Pauletta Browns  Exercises - Supine Figure 4 Piriformis Stretch  - 2-3 x daily - 7 x weekly - 1 sets - 5 reps - 20 seconds hold - Sit to Stand with Armchair  - 2 x daily - 7 x weekly - 1 sets - 5 reps - Standing Hip Hiking  - 2 x daily - 1 x weekly - 2 sets - 10 reps - 3 seconds hold - Yoga Bridge  - 2 x daily - 1 x weekly - 1 sets - 10 reps - 5 seconds hold - Clamshell  - 1 x daily - 7 x weekly - 2-3 sets - 10 reps - 5 seconds hold - Prone Hip Extension  - 1 x daily - 7 x weekly - 2-3 sets - 10 reps - 3 seconds hold - Sidelying Hip Abduction  - 1 x daily - 7 x weekly - 2 sets - 10 reps - 5 seconds hold - Seated Straight Leg Raise  - 2-3 x daily - 7 x weekly - 2-3 sets - 5 reps - 3 seconds hold   ASSESSMENT:   CLINICAL IMPRESSION: Deborah Gonzales notes continued steady progress with her strength and weight-bearing function.  She can stand and walk for 5  - 10 minute periods before needing to take a short break to rest and get off her feet.  She has been avoiding using the cane, but I encouraged her to use a cane for longer walks outside the home (was using a cane full-time).  Remaining L LE weakness (particularly quadriceps and hip abductors) is being addressed with her home and clinic programs.  She has not yet returned to work.     OBJECTIVE IMPAIRMENTS Abnormal gait, decreased activity tolerance, decreased endurance, decreased knowledge of condition, difficulty walking, decreased ROM, decreased strength, increased edema, impaired perceived functional ability, impaired flexibility, obesity, and pain.    ACTIVITY LIMITATIONS carrying, bending, sitting, standing, squatting, sleeping, stairs, and locomotion level   PARTICIPATION LIMITATIONS: community activity and occupation   PERSONAL FACTORS Hyperlipidemia, HTN, severe obesity, venous insufficiency B LE, B numbness and tingling U and LEs are also affecting patient's functional outcome.    REHAB POTENTIAL: Good   CLINICAL DECISION MAKING: Stable/uncomplicated   EVALUATION COMPLEXITY: Low     GOALS: Goals reviewed with patient? Yes   SHORT TERM GOALS: Target date: 05/09/2022    Improve gait quality so Mikia no longer needs a cane. Baseline: Needs cane 04/18/2022 Goal status: Only with prolonged WB 05/31/2022  LONG TERM GOALS: Target date: 06/28/2022    Improve FOTO to 75 in 12 visits. Baseline: 57 Goal status:  63 On Going 05/24/2022   2.  Deborah Gonzales will report L hip/IT Band pain consistently 0-2/10 on the Numeric Pain Rating Scale. Baseline: 3-6/10 Goal status:  On Going 05/31/2022   3.  Improve B hip flexibility for hip flexors to 90 degrees and hip ER to 40 degrees. Baseline: (L/R): 65/75 and 24/22 respectively Goal status: Improved from evaluation 05/24/2022   4.  Deborah Gonzales will be able to return to work without a cane. Baseline: Not working and using a cane Goal status:  On Going Using cane less 05/31/2022   5.  Deborah Gonzales will be independent with her long-term HEP at [place]. Baseline: Started 04/18/2022 Goal status: On Going 05/31/2022     PLAN: PT FREQUENCY: 1X/week   PT DURATION: 4-5 weeks   PLANNED INTERVENTIONS: Therapeutic exercises, Therapeutic activity, Neuromuscular re-education, Balance training, Gait training, Patient/Family education, Joint mobilization, Stair training, Dry Needling, Cryotherapy, and Manual therapy   PLAN FOR NEXT SESSION: Strength progressions PRN for hip abductors and quadriceps, functional challenges   Cherlyn Cushing, PT, MPT 05/31/2022, 10:30 AM

## 2022-06-08 ENCOUNTER — Encounter: Payer: PRIVATE HEALTH INSURANCE | Admitting: Rehabilitative and Restorative Service Providers"

## 2022-06-15 ENCOUNTER — Encounter: Payer: Self-pay | Admitting: Rehabilitative and Restorative Service Providers"

## 2022-06-15 ENCOUNTER — Ambulatory Visit (INDEPENDENT_AMBULATORY_CARE_PROVIDER_SITE_OTHER): Payer: PRIVATE HEALTH INSURANCE | Admitting: Rehabilitative and Restorative Service Providers"

## 2022-06-15 DIAGNOSIS — R262 Difficulty in walking, not elsewhere classified: Secondary | ICD-10-CM

## 2022-06-15 DIAGNOSIS — M25552 Pain in left hip: Secondary | ICD-10-CM

## 2022-06-15 DIAGNOSIS — M6281 Muscle weakness (generalized): Secondary | ICD-10-CM

## 2022-06-15 DIAGNOSIS — M25652 Stiffness of left hip, not elsewhere classified: Secondary | ICD-10-CM | POA: Diagnosis not present

## 2022-06-15 NOTE — Therapy (Signed)
OUTPATIENT PHYSICAL THERAPY TREATMENT/PROGRESS NOTE   Patient Name: DAYANA DALPORTO MRN: [id number] DOB:20-Jan-1981, 41 y.o., female Today's Date: 06/15/2022  Progress Note Reporting Period 04/18/2022 to 06/15/2022  See note below for Objective Data and Assessment of Progress/Goals.      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 On appropriate therapy with ACE-i   Hyperlipidemia    Hypertension    Left ankle pain 06/26/2016   Pain in both upper arms 09/05/2016   Severe obesity (BMI >= 40) (HCC)    BMI 55   Venous insufficiency of both lower extremities 05/21/2017   Past Surgical History:  Procedure Laterality Date   RENAL BIOPSY  1999   Patient Active Problem List   Diagnosis Date Noted   It band syndrome, left 04/21/2022   Pain in left leg 04/04/2022   Iron deficiency anemia 10/20/2021   Tremor 12/20/2015   Numbness and tingling in both hands 05/28/2015   Health care maintenance 12/23/2013   FSGS (focal segmental glomerulosclerosis) 07/02/2012   Hyperlipidemia 10/25/2006   Severe obesity (BMI >= 40) ([place]) 10/25/2006   Essential hypertension 10/25/2006     THERAPY DIAG:  Difficulty in walking, not elsewhere classified  Muscle weakness (generalized)  Stiffness of left hip, not elsewhere classified  Pain in left hip  PCP: Christiana Fuchs, DO   REFERRING PROVIDER: Garald Balding, MD   REFERRING DIAG: [phone] (ICD-10-CM) - Pain of left hip   Rationale for Evaluation and Treatment Rehabilitation   ONSET DATE: Mar 28, 2022.  Started in  April but got bad on 03/28/2022.   SUBJECTIVE:    SUBJECTIVE STATEMENT: Jennefer reports she has been doing pretty well with things while independent with her HEP over the past 2 weeks.  She notes significant reductions in her hip and thigh pain.  Knee pain is also significantly improved.  She is now able to walk about 45 minutes with the cane without discomfort.      PERTINENT HISTORY: Hyperlipidemia, HTN, severe obesity, venous insufficiency B LE, B numbness and tingling U and LEs   PAIN:  Are you having pain? 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  No acute fractures femoral head is well  reduced with congruent joint spacing   PATIENT SURVEYS:  06/15/2022 FOTO 77 (  Goal met) 05/24/2022 FOTO 63 (Goal 75) 04/18/2022 FOTO 57 (Goal 75 in 12 visits)   COGNITION:           Overall cognitive status: Within functional limits for tasks assessed                          SENSATION: No tingling or numbness     MUSCLE LENGTH: 06/15/2022 Hamstrings Right 55 deg; Left 55 deg 05/24/2022 Hamstrings Right 50 deg; Left 60 deg Eval Hamstrings: Right 45 deg; Left 45 deg   LOWER EXTREMITY ROM:   Passive ROM Right eval Left eval L/R in degrees 05/24/2022 L/R in degrees 06/15/2022  Hip flexion 75 65 75/85 75/80  Hip extension        Hip abduction        Hip adduction        Hip internal rotation 8 0 7/8  10/10  Hip external rotation 22 24 25/31 31/31  Knee flexion        Knee extension        Ankle dorsiflexion        Ankle plantarflexion        Ankle inversion        Ankle eversion         (Blank rows = not tested)   LOWER EXTREMITY MMT:   MMT Right 04/21/22 Left 04/21/22 L/R 05/24/2022 L/R in pounds 06/15/2022  Hip flexion  4+ 4+     Hip extension        Hip abduction  4  4 4+/5 B   Hip adduction  4 4     Hip internal rotation        Hip external rotation        Knee flexion 5  5     Knee extension  5  4+ 4/4+ 96.1/103.7  Ankle dorsiflexion        Ankle plantarflexion        Ankle inversion        Ankle eversion         (Blank rows = not tested)   LOWER EXTREMITY SPECIAL TESTS:  Hip special tests: Ober's test: positive      GAIT: 06/15/2022 Naveah is using the cane with walks for exercise but otherwise she is AD free, standing endurance is better  05/24/2022 Elvira is using the cane less frequently  04/18/2022  Comments: Aybree is using a cane to decrease pressure on her L hip/IT Band.       TODAY'S TREATMENT: 06/15/2022 Figure 4 stretch 4X 20 seconds Modified Thomas Stretch 4X 20 seconds Clams (lie R, L side works) 15 reps for 5 seconds with Blue theraband resistance Supine Bridges 5 sec hold 10 reps Side lie hip abduction (lie R/L) 2 sets of 10 for 3 seconds (needs PT help to maintain 1/4 turn from stomach) Alternating hip hike in door frame 2 sets of 5 for 3 seconds Seated straight leg raises 3 sets of 5 for 3 seconds with 1.5#   05/31/2022 Figure 4 stretch 4X 20 seconds Modified Thomas Stretch 4X 20 seconds Clams (lie R, L side works) 15 reps for 5 seconds with Blue theraband resistance Supine Bridges 5 sec hold 15 reps Reverse clam (lift foot and keep knees together) 15 reps for 5 seconds Side lie hip abduction (lie R/L) 2 sets of 10 for 3 seconds (needs PT help to maintain 1/4 turn from stomach) Alternating hip hike in door frame  2 sets of 5 for 3  seconds Seated straight leg raises 3 sets of 5 for 3 seconds with 1.5#  Functional Activities: (for stairs, curbs, sit to stand) Leg Press (sit to stand, stairs, curbs) DL 2 sets of 10 slow eccentrics 137# and SL 10X each side 62# Sit to stand slow eccentrics 10X  Step-up and over with slow eccentrics 4 and 6 inch step 10X each   05/24/2022 Figure 4 stretch 4X 20 seconds Modified Thomas Stretch 4X 20 seconds Clams (lie R, L side works) 10 reps for 5 seconds with [place] theraband resistance Supine Bridges 5 sec hold 10 reps Reverse clam (lift foot and keep knees together) 10 reps for 5 seconds Side lie hip abduction (lie R/L, lift L/R) 10 for 3 seconds Alternating hip hike in door frame 2 sets of 5 for 3 seconds Seated straight leg raises 2 sets of 5 for 3 seconds  Functional Activities: Leg Press (sit to stand, stairs, curbs) DL 2 sets of 10 slow eccentrics 125# and SL 10X each side 62# Sit to stand slow eccentrics 5X    PATIENT EDUCATION:  Education details: Reviewed exam findings and HEP Person educated: Patient Education method: Explanation, Demonstration, Tactile cues, Verbal cues, and Handouts Education comprehension: verbalized understanding, returned demonstration, verbal cues required, tactile cues required, and needs further education     HOME EXERCISE PROGRAM: Access Code: [email or web address] URL: [email or web address] Date: 05/24/2022 Prepared by: Vista Mink  Exercises - Supine Figure 4 Piriformis Stretch  - 2-3 x daily - 7 x weekly - 1 sets - 5 reps - 20 seconds hold - Sit to Stand with Armchair  - 2 x daily - 7 x weekly - 1 sets - 5 reps - Standing Hip Hiking  - 2 x daily - 1 x weekly - 2 sets - 10 reps - 3 seconds hold - Yoga Bridge  - 2 x daily - 1 x weekly - 1 sets - 10 reps - 5 seconds hold - Clamshell  - 1 x daily - 7 x weekly - 2-3 sets - 10 reps - 5 seconds hold - Prone Hip Extension  - 1 x daily - 7 x weekly - 2-3 sets - 10 reps - 3 seconds hold -  Sidelying Hip Abduction  - 1 x daily - 7 x weekly - 2 sets - 10 reps - 5 seconds hold - Seated Straight Leg Raise  - 2-3 x daily - 7 x weekly - 2-3 sets - 5 reps - 3 seconds hold   ASSESSMENT:   CLINICAL IMPRESSION: Adrie notes continued steady progress with her strength and weight-bearing function.  She can stand and walk for 10 minute periods without an assistive device before needing to take a short break to rest or hold on to something (cane or shopping cart).  She has not yet returned to work but is scheduled to return before the end of August.  She would like to continue strength progressions and longer walks outside the home to prepare for return to work.  I anticipate she will be ready for DC to independent rehabilitation in 2 weeks.     OBJECTIVE IMPAIRMENTS Abnormal gait, decreased activity tolerance, decreased endurance, decreased knowledge of condition, difficulty walking, decreased ROM, decreased strength, increased edema, impaired perceived functional ability, impaired flexibility, obesity, and pain.    ACTIVITY LIMITATIONS carrying, bending, sitting, standing, squatting, sleeping, stairs, and locomotion level   PARTICIPATION LIMITATIONS: community activity and occupation   PERSONAL FACTORS Hyperlipidemia, HTN, severe obesity,  venous insufficiency B LE, B numbness and tingling U and LEs are also affecting patient's functional outcome.    REHAB POTENTIAL: Good   CLINICAL DECISION MAKING: Stable/uncomplicated   EVALUATION COMPLEXITY: Low     GOALS: Goals reviewed with patient? Yes   SHORT TERM GOALS: Target date: 05/09/2022    Improve gait quality so Natacia no longer needs a cane. Baseline: Needs cane 04/18/2022 Goal status: Only with prolonged WB 06/15/2022     LONG TERM GOALS: Target date: 06/28/2022    Improve FOTO to 75 in 12 visits. Baseline: 57 Goal status:  Met 06/15/2022   2.  Khaleesi will report L hip/IT Band pain consistently 0-2/10 on the Numeric Pain  Rating Scale. Baseline: 3-6/10 Goal status:  On Going 06/15/2022   3.  Improve B hip flexibility for hip flexors to 90 degrees and hip ER to 40 degrees. Baseline: (L/R): 65/75 and 24/22 respectively Goal status: Improved from evaluation 06/15/2022   4.  Krystine will be able to return to work without a cane. Baseline: Not working and using a cane Goal status: On Going Using cane less 06/15/2022   5.  Gloristine will be independent with her long-term HEP at [place]. Baseline: Started 04/18/2022 Goal status: On Going 06/15/2022     PLAN: PT FREQUENCY: 1X/week   PT DURATION: 2 weeks   PLANNED INTERVENTIONS: Therapeutic exercises, Therapeutic activity, Neuromuscular re-education, Balance training, Gait training, Patient/Family education, Joint mobilization, Stair training, Dry Needling, Cryotherapy, and Manual therapy   PLAN FOR NEXT SESSION: Strength progressions PRN for hip abductors and quadriceps, functional challenges   Farley Ly, PT, MPT 06/15/2022, 4:21 PM

## 2022-06-22 ENCOUNTER — Ambulatory Visit (INDEPENDENT_AMBULATORY_CARE_PROVIDER_SITE_OTHER): Payer: PRIVATE HEALTH INSURANCE | Admitting: Rehabilitative and Restorative Service Providers"

## 2022-06-22 ENCOUNTER — Encounter: Payer: Self-pay | Admitting: Rehabilitative and Restorative Service Providers"

## 2022-06-22 DIAGNOSIS — R262 Difficulty in walking, not elsewhere classified: Secondary | ICD-10-CM

## 2022-06-22 DIAGNOSIS — M25652 Stiffness of left hip, not elsewhere classified: Secondary | ICD-10-CM

## 2022-06-22 DIAGNOSIS — M6281 Muscle weakness (generalized): Secondary | ICD-10-CM | POA: Diagnosis not present

## 2022-06-22 DIAGNOSIS — M25552 Pain in left hip: Secondary | ICD-10-CM | POA: Diagnosis not present

## 2022-06-22 NOTE — Therapy (Signed)
OUTPATIENT PHYSICAL THERAPY TREATMENT/PROGRESS NOTE   Patient Name: Deborah Gonzales MRN: [id number] DOB:05-04-1981, 41 y.o., female Today's Date: 06/22/2022  Progress Note Reporting Period 04/18/2022 to 06/15/2022  See note below for Objective Data and Assessment of Progress/Goals.      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 On appropriate therapy with ACE-i   Hyperlipidemia    Hypertension    Left ankle pain 06/26/2016   Pain in both upper arms 09/05/2016   Severe obesity (BMI >= 40) (HCC)    BMI 55   Venous insufficiency of both lower extremities 05/21/2017   Past Surgical History:  Procedure Laterality Date   RENAL BIOPSY  1999   Patient Active Problem List   Diagnosis Date Noted   It band syndrome, left 04/21/2022   Pain in left leg 04/04/2022   Iron deficiency anemia 10/20/2021   Tremor 12/20/2015   Numbness and tingling in both hands 05/28/2015   Health care maintenance 12/23/2013   FSGS (focal segmental glomerulosclerosis) 07/02/2012   Hyperlipidemia 10/25/2006   Severe obesity (BMI >= 40) ([place]) 10/25/2006   Essential hypertension 10/25/2006     THERAPY DIAG:  Difficulty in walking, not elsewhere classified  Muscle weakness (generalized)  Stiffness of left hip, not elsewhere classified  Pain in left hip  PCP: Christiana Fuchs, DO   REFERRING PROVIDER: Garald Balding, MD   REFERRING DIAG: [phone] (ICD-10-CM) - Pain of left hip   Rationale for Evaluation and Treatment Rehabilitation   ONSET DATE: Mar 28, 2022.  Started  in April but got bad on 03/28/2022.   SUBJECTIVE:    SUBJECTIVE STATEMENT: Deborah Gonzales is now able to walk 30 minutes with the cane needed for only about 5 minutes without discomfort (was 45 minutes with the cane last week).  No butt cheek pain and minimal knee pain.  L thigh can still be uncomfortable with prolonged WB.  It is described as more soreness and weakness than pain.   PERTINENT HISTORY: Hyperlipidemia, HTN, severe obesity, venous insufficiency B LE, B numbness and tingling U and LEs   PAIN:  Are you having pain? 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  No acute fractures femoral head is well  reduced with congruent joint spacing   PATIENT SURVEYS:  06/15/2022  FOTO 77 (Goal met) 05/24/2022 FOTO 63 (Goal 75) 04/18/2022 FOTO 57 (Goal 75 in 12 visits)   COGNITION:           Overall cognitive status: Within functional limits for tasks assessed                          SENSATION: No tingling or numbness     MUSCLE LENGTH: 06/15/2022 Hamstrings Right 55 deg; Left 55 deg 05/24/2022 Hamstrings Right 50 deg; Left 60 deg Eval Hamstrings: Right 45 deg; Left 45 deg   LOWER EXTREMITY ROM:   Passive ROM Right eval Left eval L/R in degrees 05/24/2022 L/R in degrees 06/15/2022  Hip flexion 75 65 75/85 75/80  Hip extension        Hip abduction        Hip adduction        Hip  internal rotation 8 0 7/8 10/10  Hip external rotation 22 24 25/31 31/31  Knee flexion        Knee extension        Ankle dorsiflexion        Ankle plantarflexion        Ankle inversion        Ankle eversion         (Blank rows = not tested)   LOWER EXTREMITY MMT:   MMT Right 04/21/22 Left 04/21/22 L/R 05/24/2022 L/R in pounds 06/15/2022  Hip flexion  4+ 4+     Hip extension        Hip abduction  4  4 4+/5 B   Hip adduction  4 4     Hip internal rotation        Hip external rotation        Knee flexion 5  5     Knee extension  5  4+ 4/4+ 96.1/103.7  Ankle dorsiflexion        Ankle plantarflexion        Ankle inversion        Ankle eversion         (Blank rows = not tested)   LOWER EXTREMITY SPECIAL TESTS:  Hip special tests: Ober's test: positive      GAIT: 06/15/2022 Deborah Gonzales is using the cane with walks for exercise but otherwise she is AD free, standing endurance is better  05/24/2022 Deborah Gonzales is using the cane less frequently  04/18/2022  Comments: Deborah Gonzales is using a cane to decrease pressure on her L hip/IT Band.       TODAY'S TREATMENT: 06/22/2022 Figure 4 stretch 4X 20 seconds Modified Thomas Stretch 4X 20 seconds Clams (lie R, L side works) 15 reps for 5 seconds with Blue theraband resistance Supine Bridges 5 sec hold 15 reps Side lie hip abduction (lie R) 2 sets of 10 for 3 seconds (needs PT help to maintain 1/4 turn from stomach) Alternating hip hike in door frame 2 sets of 5 for 3 seconds Seated straight leg raises 3 sets of 5 for 3 seconds with 1.5#  Functional Activities: (for stairs, curbs, sit to stand) Leg Press (sit to stand, stairs, curbs) DL 10X slow eccentrics 137# and SL 10X each side 62# Sit to stand slow eccentrics 5X   06/15/2022 Figure 4 stretch 4X 20 seconds Modified Thomas Stretch 4X 20 seconds Clams (lie R, L side works) 15 reps for 5 seconds with Blue theraband resistance Supine Bridges 5 sec hold 10 reps Side lie hip abduction (lie  R/L) 2 sets of 10 for 3 seconds (needs PT help to maintain 1/4 turn from stomach) Alternating hip hike in door frame 2 sets of 5 for 3 seconds Seated straight leg raises 3 sets of 5 for 3 seconds with 1.5#   05/31/2022 Figure 4 stretch 4X 20 seconds Modified Thomas Stretch 4X 20 seconds Clams (lie R, L side works) 15 reps for 5 seconds with Blue theraband resistance Supine Bridges 5 sec hold 15 reps Reverse clam (lift foot and keep knees together) 15 reps for 5 seconds Side lie hip abduction (lie R/L) 2 sets of 10 for 3 seconds (needs PT help to maintain 1/4 turn from stomach) Alternating hip hike in door frame 2 sets of 5 for 3 seconds Seated straight leg raises 3 sets of 5 for 3 seconds with 1.5#  Functional Activities: (for stairs, curbs, sit to stand) Leg Press (sit to stand, stairs, curbs) DL 2 sets of 10 slow eccentrics 137# and SL 10X each side 62# Sit to stand slow eccentrics 10X  Step-up and over with slow eccentrics 4 and 6 inch step 10X each    PATIENT EDUCATION:  Education details: Reviewed exam findings and HEP Person educated: Patient Education method: Explanation, Demonstration, Tactile cues, Verbal cues, and Handouts Education comprehension: verbalized understanding, returned demonstration, verbal cues required, tactile cues required, and needs further education     HOME EXERCISE PROGRAM: Access Code: [email or web address] URL: [email or web address] Date: 05/24/2022 Prepared by: Vista Mink  Exercises - Supine Figure 4 Piriformis Stretch  - 2-3 x daily - 7 x weekly - 1 sets - 5 reps - 20 seconds hold - Sit to Stand with Armchair  - 2 x daily - 7 x weekly - 1 sets - 5 reps - Standing Hip Hiking  - 2 x daily - 1 x weekly - 2 sets - 10 reps - 3 seconds hold - Yoga Bridge  - 2 x daily - 1 x weekly - 1 sets - 10 reps - 5 seconds hold - Clamshell  - 1 x daily - 7 x weekly - 2-3 sets - 10 reps - 5 seconds hold - Prone Hip Extension  - 1 x daily - 7 x weekly - 2-3  sets - 10 reps - 3 seconds hold - Sidelying Hip Abduction  - 1 x daily - 7 x weekly - 2 sets - 10 reps - 5 seconds hold - Seated Straight Leg Raise  - 2-3 x daily - 7 x weekly - 2-3 sets - 5 reps - 3 seconds hold   ASSESSMENT:   CLINICAL IMPRESSION: Malisha feels stronger since starting physical therapy and since last week.  She was able to walk for 30 minutes and only needed her cane for 5 minutes (was 10-15 minutes endurance without a cane or shopping cart).  She is on target to return to work before the end of August (21st).  She would like to continue strength progressions and longer walks outside the home to prepare for return to work.  I anticipate she will be ready for DC to independent rehabilitation in 1-2 weeks.     OBJECTIVE IMPAIRMENTS Abnormal gait, decreased activity tolerance, decreased endurance, decreased knowledge of condition, difficulty walking, decreased ROM, decreased strength, increased edema, impaired perceived functional ability, impaired flexibility, obesity, and pain.    ACTIVITY LIMITATIONS carrying, bending, sitting, standing, squatting, sleeping, stairs, and locomotion level   PARTICIPATION LIMITATIONS: community activity and occupation   PERSONAL FACTORS Hyperlipidemia, HTN, severe obesity, venous insufficiency B LE,  B numbness and tingling U and LEs are also affecting patient's functional outcome.    REHAB POTENTIAL: Good   CLINICAL DECISION MAKING: Stable/uncomplicated   EVALUATION COMPLEXITY: Low     GOALS: Goals reviewed with patient? Yes   SHORT TERM GOALS: Target date: 05/09/2022    Improve gait quality so Len no longer needs a cane. Baseline: Needs cane 04/18/2022 Goal status: Only with prolonged WB 06/22/2022     LONG TERM GOALS: Target date: 06/28/2022    Improve FOTO to 75 in 12 visits. Baseline: 57 Goal status:  Met 06/15/2022   2.  Cedrica will report L hip/IT Band pain consistently 0-2/10 on the Numeric Pain Rating  Scale. Baseline: 3-6/10 Goal status:  Met 06/22/2022   3.  Improve B hip flexibility for hip flexors to 90 degrees and hip ER to 40 degrees. Baseline: (L/R): 65/75 and 24/22 respectively Goal status: Improved from evaluation 06/15/2022   4.  Dorann will be able to return to work without a cane. Baseline: Not working and using a cane Goal status: On Going Using cane less 06/22/2022   5.  Margeart will be independent with her long-term HEP at DC. Baseline: Started 04/18/2022 Goal status: On Going 06/22/2022     PLAN: PT FREQUENCY: 1X/week   PT DURATION: 1-2 weeks   PLANNED INTERVENTIONS: Therapeutic exercises, Therapeutic activity, Neuromuscular re-education, Balance training, Gait training, Patient/Family education, Joint mobilization, Stair training, Dry Needling, Cryotherapy, and Manual therapy   PLAN FOR NEXT SESSION: Strength progressions PRN for hip abductors and quadriceps, functional challenges   Farley Ly, PT, MPT 06/22/2022, 10:14 AM

## 2022-06-29 ENCOUNTER — Encounter: Payer: Self-pay | Admitting: Rehabilitative and Restorative Service Providers"

## 2022-06-29 ENCOUNTER — Ambulatory Visit (INDEPENDENT_AMBULATORY_CARE_PROVIDER_SITE_OTHER): Payer: PRIVATE HEALTH INSURANCE | Admitting: Rehabilitative and Restorative Service Providers"

## 2022-06-29 DIAGNOSIS — R262 Difficulty in walking, not elsewhere classified: Secondary | ICD-10-CM | POA: Diagnosis not present

## 2022-06-29 DIAGNOSIS — M25652 Stiffness of left hip, not elsewhere classified: Secondary | ICD-10-CM | POA: Diagnosis not present

## 2022-06-29 DIAGNOSIS — M6281 Muscle weakness (generalized): Secondary | ICD-10-CM

## 2022-06-29 DIAGNOSIS — M25552 Pain in left hip: Secondary | ICD-10-CM | POA: Diagnosis not present

## 2022-06-29 NOTE — Therapy (Signed)
OUTPATIENT PHYSICAL THERAPY TREATMENT/DISCHARGE NOTE   Patient Name: Deborah Gonzales MRN: [id number] DOB:[birth year], 41 y.o., female Today's Date: 06/29/2022  PHYSICAL THERAPY DISCHARGE SUMMARY  Visits from Start of Care: 12  Current functional level related to goals / functional outcomes: See note   Remaining deficits: See note   Education / Equipment: Updated HEP   Patient agrees to discharge. Patient goals were met. Patient is being discharged due to being pleased with the current functional level.   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 On appropriate therapy with ACE-i   Hyperlipidemia    Hypertension    Left ankle pain 06/26/2016   Pain in both upper arms 09/05/2016   Severe obesity (BMI >= 40) (HCC)    BMI 55   Venous insufficiency of both lower extremities 05/21/2017   Past Surgical History:  Procedure Laterality Date   RENAL BIOPSY  1999   Patient Active Problem List   Diagnosis Date Noted   It band syndrome, left 04/21/2022   Pain in left leg 04/04/2022   Iron deficiency anemia 10/20/2021   Tremor 12/20/2015   Numbness and tingling in both hands 05/28/2015   Health care maintenance 12/23/2013   FSGS (focal segmental glomerulosclerosis) 07/02/2012   Hyperlipidemia 10/25/2006   Severe obesity (BMI >= 40) ([place]) 10/25/2006   Essential hypertension 10/25/2006     THERAPY DIAG:  Difficulty in walking, not elsewhere classified  Muscle weakness (generalized)  Stiffness of left hip, not elsewhere classified  Pain in  left hip  PCP: Christiana Fuchs, DO   REFERRING PROVIDER: Garald Balding, MD   REFERRING DIAG: [phone] (ICD-10-CM) - Pain of left hip   Rationale for Evaluation and Treatment Rehabilitation   ONSET DATE: Mar 28, 2022.  Started in April but got bad on 03/28/2022.   SUBJECTIVE:    SUBJECTIVE STATEMENT: Deborah Gonzales is now walking up to 30 minutes with the cane needed for only about 5 minutes without discomfort (was about 10 minutes with the cane before starting PT).  No butt cheek pain and minimal knee pain.  L thigh can still be uncomfortable with prolonged WB.  It is described as more soreness and weakness than pain.   PERTINENT HISTORY: Hyperlipidemia, HTN, severe obesity, venous insufficiency B LE, B numbness and tingling U and LEs   PAIN:  Are you having pain? 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  No acute fractures femoral head is well  reduced with congruent joint spacing   PATIENT SURVEYS:  06/15/2022 FOTO 77 (Goal met) 05/24/2022 FOTO 63 (Goal 75) 04/18/2022 FOTO 57 (Goal 75 in 12 visits)   COGNITION:           Overall cognitive status: Within functional limits for tasks assessed                          SENSATION: No tingling or numbness     MUSCLE LENGTH: 06/15/2022 Hamstrings Right 55 deg; Left 55 deg 05/24/2022 Hamstrings Right 50 deg; Left 60 deg Eval  Hamstrings: Right 45 deg; Left 45 deg   LOWER EXTREMITY ROM:   Passive ROM Right eval Left eval L/R in degrees 05/24/2022 L/R in degrees 06/15/2022  Hip flexion 75 65 75/85 75/80  Hip extension        Hip abduction        Hip adduction        Hip internal rotation 8 0 7/8 10/10  Hip external rotation 22 24 25/31 31/31  Knee flexion        Knee extension        Ankle dorsiflexion        Ankle plantarflexion        Ankle inversion        Ankle eversion         (Blank rows = not tested)   LOWER EXTREMITY MMT:   MMT Right 04/21/22 Left 04/21/22 L/R 05/24/2022 L/R in pounds 06/15/2022  Hip flexion  4+ 4+     Hip extension        Hip abduction  4  4 4+/5 B   Hip adduction  4 4     Hip internal rotation        Hip external rotation        Knee flexion 5  5     Knee extension  5  4+ 4/4+ 96.1/103.7  Ankle dorsiflexion        Ankle plantarflexion        Ankle inversion        Ankle eversion         (Blank rows = not tested)   LOWER EXTREMITY SPECIAL TESTS:  Hip special tests: Ober's test: positive      GAIT: 06/15/2022 Deborah Gonzales is using the cane with walks for exercise but otherwise she is AD free, standing endurance is better  05/24/2022 Deborah Gonzales is using the cane less frequently  04/18/2022  Comments: Deborah Gonzales is using a cane to decrease pressure on her L hip/IT Band.       TODAY'S TREATMENT: 06/29/2022 Figure 4 stretch 4X 20 seconds Modified Thomas Stretch 4X 20 seconds Clams (lie R, L side works) 15 reps for 5 seconds with Blue theraband resistance Supine Bridges 5 sec hold 15 reps Side lie hip abduction (lie R) 2 sets of 10 for 3 seconds (needs PT help to maintain 1/4 turn from stomach) Alternating hip hike in door frame 2 sets of 5 for 3 seconds Seated straight leg raises 1 set of 5 for 3 seconds with 1.5#  Functional Activities: (for stairs, curbs, sit to stand) Sit to stand slow eccentrics 5X   06/22/2022 Figure 4 stretch 4X 20 seconds Modified Thomas Stretch 4X  20 seconds Clams (lie R,  L side works) 15 reps for 5 seconds with Blue theraband resistance Supine Bridges 5 sec hold 15 reps Side lie hip abduction (lie R) 2 sets of 10 for 3 seconds (needs PT help to maintain 1/4 turn from stomach) Alternating hip hike in door frame 2 sets of 5 for 3 seconds Seated straight leg raises 3 sets of 5 for 3 seconds with 1.5#  Functional Activities: (for stairs, curbs, sit to stand) Leg Press (sit to stand, stairs, curbs) DL 10X slow eccentrics 137# and SL 10X each side 62# Sit to stand slow eccentrics 5X   06/15/2022 Figure 4 stretch 4X 20 seconds Modified Thomas Stretch 4X 20 seconds Clams (lie R, L side works) 15 reps for 5 seconds with Blue theraband resistance Supine Bridges 5 sec hold 10 reps Side lie hip abduction (lie R/L) 2 sets of 10 for 3 seconds (needs PT help to maintain 1/4 turn from stomach) Alternating hip hike in door frame 2 sets of 5 for 3 seconds Seated straight leg raises 3 sets of 5 for 3 seconds with 1.5#    PATIENT EDUCATION:  Education details: Reviewed exam findings and HEP Person educated: Patient Education method: Explanation, Demonstration, Tactile cues, Verbal cues, and Handouts Education comprehension: verbalized understanding, returned demonstration, verbal cues required, tactile cues required, and needs further education     HOME EXERCISE PROGRAM: Access Code: [email or web address] URL: [email or web address] Date: 06/29/2022 Prepared by: Vista Mink  Exercises - Supine Figure 4 Piriformis Stretch  - 1 x daily - 7 x weekly - 1 sets - 5 reps - 20 seconds hold - Sit to Stand with Armchair  - 2 x daily - 3 x weekly - 1 sets - 5 reps - Standing Hip Hiking  - 2 x daily - 3 x weekly - 2 sets - 10 reps - 3 seconds hold - Yoga Bridge  - 1 x daily - 3 x weekly - 1 sets - 15 reps - 5 seconds hold - Clamshell  - 1 x daily - 3 x weekly - 2 sets - 10 reps - 5 seconds hold - Prone Hip Extension  - 1 x daily - 1 x weekly - 2  sets - 10 reps - 3 seconds hold - Sidelying Hip Abduction  - 1 x daily - 1 x weekly - 2 sets - 10 reps - 5 seconds hold - Modified Thomas Stretch  - 1 x daily - 7 x weekly - 5 reps - 20 seconds hold    ASSESSMENT:   CLINICAL IMPRESSION: Deborah Gonzales feels much stronger since starting physical therapy and will be returning to work tomorrow.  She has been walking for for 30+ minutes and only needs her cane for short intervals.  She feels ready to transfer into independent rehabilitation.  She is welcome to return if requested if she has any difficulty with return to work.     OBJECTIVE IMPAIRMENTS Abnormal gait, decreased activity tolerance, decreased endurance, decreased knowledge of condition, difficulty walking, decreased ROM, decreased strength, increased edema, impaired perceived functional ability, impaired flexibility, obesity, and pain.    ACTIVITY LIMITATIONS carrying, bending, sitting, standing, squatting, sleeping, stairs, and locomotion level   PARTICIPATION LIMITATIONS: community activity and occupation   PERSONAL FACTORS Hyperlipidemia, HTN, severe obesity, venous insufficiency B LE, B numbness and tingling U and LEs are also affecting patient's functional outcome.    REHAB POTENTIAL: Good   CLINICAL DECISION MAKING: Stable/uncomplicated   EVALUATION COMPLEXITY: Low     GOALS: Goals reviewed with patient?  Yes   SHORT TERM GOALS: Target date: 05/09/2022    Improve gait quality so Deborah Gonzales no longer needs a cane. Baseline: Needs cane 04/18/2022 Goal status: Only with prolonged WB 06/29/2022     LONG TERM GOALS: Target date: 06/28/2022    Improve FOTO to 75 in 12 visits. Baseline: 57 Goal status:  Met 06/15/2022   2.  Deborah Gonzales will report L hip/IT Band pain consistently 0-2/10 on the Numeric Pain Rating Scale. Baseline: 3-6/10 Goal status:  Met 06/22/2022   3.  Improve B hip flexibility for hip flexors to 90 degrees and hip ER to 40 degrees. Baseline: (L/R): 65/75 and  24/22 respectively Goal status: Improved from evaluation 06/15/2022   4.  Deborah Gonzales will be able to return to work without a cane. Baseline: Not working and using a cane Goal status: On Going Using cane less 06/29/2022   5.  Deborah Gonzales will be independent with her long-term HEP at DC. Baseline: Started 04/18/2022 Goal status: Met 06/29/2022     PLAN: PT FREQUENCY: DC   PT DURATION: DC   PLANNED INTERVENTIONS: Therapeutic exercises, Therapeutic activity, Neuromuscular re-education, Balance training, Gait training, Patient/Family education, Joint mobilization, Stair training, Dry Needling, Cryotherapy, and Manual therapy   PLAN FOR NEXT SESSION: DC   Farley Ly, PT, MPT 06/29/2022, 10:18 AM

## 2022-07-07 ENCOUNTER — Telehealth: Payer: Self-pay | Admitting: Physician Assistant

## 2022-07-07 NOTE — Telephone Encounter (Signed)
LMOM for patient letting her know we wrote that note for her

## 2022-07-07 NOTE — Telephone Encounter (Signed)
Patient called in requesting work note releasing her back to work stated she finished PT on the 17th and went back to work on the 18th and the job is requiring she bring in a note

## 2022-08-29 ENCOUNTER — Other Ambulatory Visit: Payer: Self-pay | Admitting: Internal Medicine

## 2023-01-15 ENCOUNTER — Other Ambulatory Visit: Payer: Self-pay | Admitting: Internal Medicine

## 2023-01-15 DIAGNOSIS — I10 Essential (primary) hypertension: Secondary | ICD-10-CM

## 2023-01-18 ENCOUNTER — Encounter: Payer: Self-pay | Admitting: Radiology

## 2023-02-28 ENCOUNTER — Other Ambulatory Visit: Payer: Self-pay | Admitting: Internal Medicine

## 2023-03-15 ENCOUNTER — Other Ambulatory Visit: Payer: Self-pay | Admitting: Internal Medicine

## 2023-03-15 DIAGNOSIS — Z1231 Encounter for screening mammogram for malignant neoplasm of breast: Secondary | ICD-10-CM

## 2023-03-28 ENCOUNTER — Other Ambulatory Visit: Payer: Self-pay

## 2023-03-28 ENCOUNTER — Encounter: Payer: Self-pay | Admitting: Student

## 2023-03-28 ENCOUNTER — Ambulatory Visit (INDEPENDENT_AMBULATORY_CARE_PROVIDER_SITE_OTHER): Payer: No Typology Code available for payment source | Admitting: Student

## 2023-03-28 VITALS — BP 120/79 | HR 87 | Temp 98.1°F | Ht 66.0 in | Wt 318.7 lb

## 2023-03-28 DIAGNOSIS — Z0000 Encounter for general adult medical examination without abnormal findings: Secondary | ICD-10-CM

## 2023-03-28 DIAGNOSIS — E785 Hyperlipidemia, unspecified: Secondary | ICD-10-CM

## 2023-03-28 DIAGNOSIS — I10 Essential (primary) hypertension: Secondary | ICD-10-CM | POA: Diagnosis not present

## 2023-03-28 DIAGNOSIS — D509 Iron deficiency anemia, unspecified: Secondary | ICD-10-CM | POA: Diagnosis not present

## 2023-03-28 DIAGNOSIS — Z1159 Encounter for screening for other viral diseases: Secondary | ICD-10-CM

## 2023-03-28 DIAGNOSIS — D508 Other iron deficiency anemias: Secondary | ICD-10-CM

## 2023-03-28 DIAGNOSIS — E7800 Pure hypercholesterolemia, unspecified: Secondary | ICD-10-CM

## 2023-03-28 DIAGNOSIS — Z6841 Body Mass Index (BMI) 40.0 and over, adult: Secondary | ICD-10-CM

## 2023-03-28 NOTE — Patient Instructions (Addendum)
It was a pleasure seeing you in clinic today  Please continue your current medications  We will check blood work for kidneys, iron deficiency, cholesterol, and hepatitis C  Follow up 6 months

## 2023-03-29 ENCOUNTER — Other Ambulatory Visit: Payer: Self-pay | Admitting: Student

## 2023-03-29 DIAGNOSIS — I10 Essential (primary) hypertension: Secondary | ICD-10-CM

## 2023-03-29 LAB — BMP8+ANION GAP
Anion Gap: 14 mmol/L (ref 10.0–18.0)
BUN/Creatinine Ratio: 30 — ABNORMAL HIGH (ref 9–23)
BUN: 13 mg/dL (ref 6–24)
CO2: 23 mmol/L (ref 20–29)
Calcium: 8.9 mg/dL (ref 8.7–10.2)
Chloride: 102 mmol/L (ref 96–106)
Creatinine, Ser: 0.43 mg/dL — ABNORMAL LOW (ref 0.57–1.00)
Glucose: 96 mg/dL (ref 70–99)
Potassium: 3.9 mmol/L (ref 3.5–5.2)
Sodium: 139 mmol/L (ref 134–144)
eGFR: 124 mL/min/{1.73_m2} (ref >59)

## 2023-03-29 LAB — CBC
Hematocrit: 36.8 % (ref 34.0–46.6)
Hemoglobin: 12 g/dL (ref 11.1–15.9)
MCH: 25.7 pg — ABNORMAL LOW (ref 26.6–33.0)
MCHC: 32.6 g/dL (ref 31.5–35.7)
MCV: 79 fL (ref 79–97)
Platelets: 405 10*3/uL (ref 150–450)
RBC: 4.67 x10E6/uL (ref 3.77–5.28)
RDW: 14.9 % (ref 11.7–15.4)
WBC: 4.8 10*3/uL (ref 3.4–10.8)

## 2023-03-29 LAB — IRON,TIBC AND FERRITIN PANEL
Ferritin: 14 ng/mL — ABNORMAL LOW (ref 15–150)
Iron Saturation: 8 % — CL (ref 15–55)
Iron: 27 ug/dL (ref 27–159)
Total Iron Binding Capacity: 340 ug/dL (ref 250–450)
UIBC: 313 ug/dL (ref 131–425)

## 2023-03-29 LAB — LIPID PANEL
Chol/HDL Ratio: 3.7 ratio (ref 0.0–4.4)
Cholesterol, Total: 177 mg/dL (ref 100–199)
HDL: 48 mg/dL (ref >39)
LDL Chol Calc (NIH): 117 mg/dL — ABNORMAL HIGH (ref 0–99)
Triglycerides: 64 mg/dL (ref 0–149)
VLDL Cholesterol Cal: 12 mg/dL (ref 5–40)

## 2023-03-29 LAB — HCV INTERPRETATION

## 2023-03-29 LAB — HCV AB W REFLEX TO QUANT PCR: HCV Ab: NONREACTIVE

## 2023-03-29 MED ORDER — FERROUS SULFATE 325 (65 FE) MG PO TBEC: 325.0000 mg | DELAYED_RELEASE_TABLET | Freq: Every day | ORAL | 2 refills | Status: DC

## 2023-03-29 MED ORDER — HYDROCHLOROTHIAZIDE 25 MG PO TABS: 25.0000 mg | ORAL_TABLET | Freq: Every day | ORAL | 3 refills | Status: DC

## 2023-03-29 MED ORDER — ENALAPRIL MALEATE 20 MG PO TABS: 40.0000 mg | ORAL_TABLET | Freq: Every day | ORAL | 3 refills | Status: DC

## 2023-03-29 NOTE — Assessment & Plan Note (Signed)
Weight remains elevated at 318 lbs. BMI 51.4. Does not appear she has gain significant weight. States she watching portion sizes to limit weight gain but is not particularly interested in weight loss at this time. Will continue to discuss this with her at future visit.

## 2023-03-29 NOTE — Assessment & Plan Note (Signed)
History of iron deficiency when last checked in 2022. States she eats a normal diet but rarely has red meat.  Otherwise she does not have other diet restrictions.  Declined iron supplementation.  Reports she has regular menstrual cycles and denies any other blood loss.  Has fatigue, shortness of breath, weakness. Will repeat CBC and iron panel today.

## 2023-03-29 NOTE — Assessment & Plan Note (Signed)
Well controlled on enalapril 40 mg daily and HCTZ 25 mg daily. Will check BMP today.

## 2023-03-29 NOTE — Assessment & Plan Note (Signed)
Repeat lipid panel today. 

## 2023-03-29 NOTE — Assessment & Plan Note (Signed)
Hepatitis C ordered. She declined PAP smear due to history with discomfort with exam and is not sexually active. Mammogram has been ordered encouraged her to get this done.

## 2023-03-29 NOTE — Progress Notes (Signed)
[facility] Clinic Attending ? ?Case discussed with Dr. Liang  At the time of the visit.  We reviewed the resident?s history and exam and pertinent patient test results.  I agree with the assessment, diagnosis, and plan of care documented in the resident?s note. ? ?

## 2023-03-29 NOTE — Progress Notes (Signed)
Established Patient Office Visit  Subjective   Patient ID: CASHE GERLITZ, female    DOB: 07/23/1981  Age: 42 y.o. MRN: [id number]  Chief Complaint  Patient presents with   Follow-up    Routine office visit / medication refill/    CHARISMA FERRIS is a 42 y.o. person living with a history listed below who presents to clinic for for follow up. Please refer to problem based charting for further details and assessment and plan of current problem and chronic medical conditions.     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  HENT:     Head: Normocephalic and atraumatic.     Mouth/Throat:     Mouth: Mucous membranes are moist.     Pharynx: Oropharynx is clear.  Eyes:     Extraocular Movements: Extraocular movements intact.     Conjunctiva/sclera: Conjunctivae normal.     Pupils: Pupils are equal, round, and reactive to light.  Cardiovascular:     Rate and Rhythm: Normal rate and regular rhythm.     Pulses: Normal pulses.     Heart sounds: No murmur heard.    No friction rub. No gallop.  Pulmonary:     Effort: Pulmonary effort is normal.     Breath sounds: No rhonchi or  rales.  Abdominal:     General: Abdomen is flat. Bowel sounds are normal. There is no distension.     Palpations: Abdomen is soft.     Tenderness: There is no abdominal tenderness.  Musculoskeletal:        General: Normal range of motion.     Right lower leg: No edema.     Left lower leg: No edema.  Skin:    General: Skin is warm and dry.     Capillary Refill: Capillary refill takes less than 2 seconds.  Neurological:     General: No focal deficit present.     Mental Status: She is alert and oriented to person, place, and time.  Psychiatric:        Mood and Affect: Mood normal.        Behavior: Behavior normal.      Results for orders placed or performed in visit on 03/28/23  CBC no Diff  Result Value Ref Range   WBC 4.8 3.4 - 10.8 x10E3/uL   RBC 4.67 3.77 - 5.28 x10E6/uL   Hemoglobin 12.0 11.1 - 15.9 g/dL   Hematocrit 40.9 81.1 - 46.6 %   MCV 79 79 - 97 fL   MCH 25.7 (  L) 26.6 - 33.0 pg   MCHC 32.6 31.5 - 35.7 g/dL   RDW 29.5 62.1 - 30.8 %   Platelets 405 150 - 450 x10E3/uL  Iron, TIBC and Ferritin Panel  Result Value Ref Range   Total Iron Binding Capacity 340 250 - 450 ug/dL   UIBC 657 846 - 962 ug/dL   Iron 27 27 - 952 ug/dL   Iron Saturation 8 (LL) 15 - 55 %   Ferritin 14 (L) 15 - 150 ng/mL  BMP8+Anion Gap  Result Value Ref Range   Glucose 96 70 - 99 mg/dL   BUN 13 6 - 24 mg/dL   Creatinine, Ser 8.41 (L) 0.57 - 1.00 mg/dL   eGFR 324 >40 NU/UVO/5.36   BUN/Creatinine Ratio 30 (H) 9 - 23   Sodium 139 134 - 144 mmol/L   Potassium 3.9 3.5 - 5.2 mmol/L   Chloride 102 96 - 106 mmol/L   CO2 23 20 - 29 mmol/L   Anion Gap 14.0 10.0 - 18.0 mmol/L   Calcium 8.9 8.7 - 10.2 mg/dL  Lipid Profile  Result Value Ref Range   Cholesterol, Total 177 100 - 199 mg/dL   Triglycerides 64 0 - 149 mg/dL   HDL 48 >64 mg/dL   VLDL Cholesterol Cal 12 5 - 40 mg/dL   LDL Chol Calc (NIH) 403 (H) 0 - 99 mg/dL   Chol/HDL Ratio 3.7 0.0 - 4.4 ratio  Hepatitis C Ab reflex to Quant PCR   Result Value Ref Range   HCV Ab Non Reactive Non Reactive  Interpretation:  Result Value Ref Range   HCV Interp 1: Comment     Last metabolic panel Lab Results  Component Value Date   GLUCOSE 96 03/28/2023   NA 139 03/28/2023   K 3.9 03/28/2023   CL 102 03/28/2023   CO2 23 03/28/2023   BUN 13 03/28/2023   CREATININE 0.43 (L) 03/28/2023   EGFR 124 03/28/2023   CALCIUM 8.9 03/28/2023   PROT 6.9 10/18/2021   ALBUMIN 4.1 10/18/2021   LABGLOB 2.8 10/18/2021   AGRATIO 1.5 10/18/2021   BILITOT 0.4 10/18/2021   ALKPHOS 92 10/18/2021   AST 20 10/18/2021   ALT 15 10/18/2021   Last lipids Lab Results  Component Value Date   CHOL 177 03/28/2023   HDL 48 03/28/2023   LDLCALC 117 (H) 03/28/2023   TRIG 64 03/28/2023   CHOLHDL 3.7 03/28/2023      The 10-year ASCVD risk score (Arnett DK, et al., 2019) is: 1.7%    Assessment & Plan:   Problem List Items Addressed This Visit     Hyperlipidemia    Repeat lipid panel today.      Relevant Orders   Lipid Profile (Completed)   Severe obesity (BMI >= 40) (HCC)    Weight remains elevated at 318 lbs. BMI 51.4. Does not appear she has gain significant weight. States she watching portion sizes to limit weight gain but is not particularly interested in weight loss at this time. Will continue to discuss this with her at future visit.       Essential hypertension - Primary    Well controlled on enalapril 40 mg daily and HCTZ 25 mg daily. Will check BMP today.      Relevant Orders   BMP8+Anion Gap (Completed)   Health care maintenance    Hepatitis C ordered. She declined PAP smear due to history with discomfort with exam and is not sexually active. Mammogram has been ordered  encouraged her to get this done.       Iron deficiency anemia    History of iron deficiency when last checked in 2022. States she eats a normal diet but rarely has red meat.  Otherwise she does not have other diet restrictions.  Declined iron supplementation.   Reports she has regular menstrual cycles and denies any other blood loss.  Has fatigue, shortness of breath, weakness. Will repeat CBC and iron panel today.      Relevant Orders   CBC no Diff (Completed)   Iron, TIBC and Ferritin Panel (Completed)   Other Visit Diagnoses     Encounter for hepatitis C screening test for low risk patient       Relevant Orders   Hepatitis C Ab reflex to Quant PCR (Completed)       Return in about 6 months (around 09/28/2023) for follow up meet PCP.    Quincy Simmonds, MD

## 2023-04-18 ENCOUNTER — Ambulatory Visit: Payer: PRIVATE HEALTH INSURANCE

## 2023-06-18 ENCOUNTER — Ambulatory Visit
Admission: RE | Admit: 2023-06-18 | Discharge: 2023-06-18 | Disposition: A | Discharge status: Home / Self Care | Payer: No Typology Code available for payment source | Source: Ambulatory Visit | Attending: Internal Medicine

## 2023-06-18 DIAGNOSIS — Z1231 Encounter for screening mammogram for malignant neoplasm of breast: Secondary | ICD-10-CM

## 2023-09-12 ENCOUNTER — Encounter: Payer: Self-pay | Admitting: Internal Medicine

## 2023-09-12 ENCOUNTER — Ambulatory Visit (INDEPENDENT_AMBULATORY_CARE_PROVIDER_SITE_OTHER): Payer: No Typology Code available for payment source | Admitting: Internal Medicine

## 2023-09-12 ENCOUNTER — Other Ambulatory Visit: Payer: Self-pay

## 2023-09-12 VITALS — BP 109/60 | HR 108 | Temp 98.9°F | Resp 24 | Ht 66.0 in | Wt 328.8 lb

## 2023-09-12 DIAGNOSIS — D509 Iron deficiency anemia, unspecified: Secondary | ICD-10-CM

## 2023-09-12 DIAGNOSIS — D508 Other iron deficiency anemias: Secondary | ICD-10-CM

## 2023-09-12 DIAGNOSIS — R202 Paresthesia of skin: Secondary | ICD-10-CM | POA: Diagnosis not present

## 2023-09-12 NOTE — Progress Notes (Unsigned)
Subjective:  CC: left ear numbness  HPI:  [name] is a 42 y.o. female with a past medical history stated below and presents today for 2 weeks of numbness to skin in front of her left ear.   Please see problem based assessment and plan for additional details.  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 On appropriate therapy with ACE-i   Hyperlipidemia    Hypertension    Left ankle pain 06/26/2016   Pain in both upper arms 09/05/2016   Severe obesity (BMI >= 40) (HCC)    BMI 55   Venous insufficiency of both lower extremities 05/21/2017    Current Outpatient Medications on File Prior to Visit  Medication Sig Dispense Refill   acetaminophen (TYLENOL) 500 MG tablet Take 2 tablets (1,000 mg total) by mouth every 6 (six) hours as needed. 30 tablet 0   enalapril (VASOTEC) 20 MG tablet Take 2 tablets (40 mg total) by mouth daily. 90 tablet 3   ferrous sulfate 325 (65 FE) MG EC tablet Take 1 tablet (325 mg total) by mouth daily. 90 tablet 2   hydrochlorothiazide (HYDRODIURIL) 25 MG tablet Take 1 tablet (25 mg total) by mouth daily. 90 tablet 3   No current facility-administered medications on file prior to visit.    Family History  Problem Relation Age of Onset   Hypertension Mother    Hypertension Father    Diabetes Father     Social History   Socioeconomic History   Marital status: Single    Spouse name: Not on file   Number of children: 0   Years of education: college   Highest education level: Not on file  Occupational History   Occupation: [employer]    Comment: CNA/med tech  Tobacco Use   Smoking status: Never   Smokeless tobacco: Never  Substance and Sexual Activity   Alcohol use: No    Alcohol/week: 0.0 standard drinks of alcohol   Drug use: No   Sexual activity: Not on file  Other Topics Concern   Not on file  Social History Narrative    Patient does not drink caffeine.   Patient is right handed.    Social Determinants of Health   Financial Resource Strain: Not on file  Food Insecurity: Not on file  Transportation Needs: Not on file  Physical Activity: Not on file  Stress: Not on file  Social Connections: Not on file  Intimate Partner Violence: Not on file    Review of Systems: ROS negative except for what is noted on the assessment and plan.  Objective:   Vitals:   09/12/23 1315  BP: 109/60  Pulse: (!) 108  Resp: (!) 24  Temp: 98.9 F (37.2 C)  TempSrc: Oral  SpO2: 100%  Weight: (!) 328 lb 12.8 oz (149.1 kg)  Height: 5\' 6"  (1.676 m)    Physical Exam: Constitutional: well-appearing HENT: no rash present to pinna or with canal, TM on right without effusion, difficult to see TM on left, no lymphadenopathy appreciated to post auricular region of neck, teeth are without notable caries, no erythema to gumline appreciated Cardiovascular: regular rate and rhythm, no m/r/g Pulmonary/Chest: normal work of breathing on room air, lungs clear to auscultation bilaterally Neurological:  Cranial Nerves: II: Pupils equal, round, and reactive to light.   III,IV, VI: EOMI without ptosis or diploplia.  V: Facial sensation  with numbness to V3 distribution on left VII: Facial movement is symmetric.  VIII: Hearing is intact to voice X: Uvula elevates symmetrically XI: Shoulder shrug is symmetric. XII: Tongue is midline without atrophy or fasciculations.  Skin: warm and dry   Assessment & Plan:  Iron deficiency anemia Lab Results  Component Value Date   WBC 5.9 09/12/2023   HGB 12.7 09/12/2023   HCT 39.7 09/12/2023   MCV 87 09/12/2023   PLT 344 09/12/2023   Never had colonoscopy Still has periods but not heavy No fatigue or shortness of breath. P: Continue Ferrous sulfate 325 mg daily Recheck iron studies  She is not interested in referral to GI for colon cancer screening  Paresthesia Presents with 2  weeks of numbness to preauricular region on left side. Numbness does not extend to skin but seems to be from ear mid mandible region. She has not noticed difficulty with moving facial muscles or changes in hearing. Also denies rashes or pain close to ear. She denies dental pain A: No masses or enlarged lymph nodes appreciated. Differential for this is broad for paresthesia versus numb chin syndrome. She could have compression of branch of trigeminal nerve or could be more central problem as well.  P: CBC, CMP to evaluate anemia and electrolyte derangements CT maxillofacial for see nodule causing compression MRI w contrast for enhancing lesion, MS? Patient declines HIV testing A1c screening   Patient discussed with Dr. Josetta Gonzales Deborah Gonzales, D.O. [facility]  [id number] Pager: [phone]  Phone: [phone] Date 09/13/2023  Time 7:50 AM

## 2023-09-12 NOTE — Patient Instructions (Signed)
Numb left side ear into jaw- I have ordered a few tests to evaluate this. This will look for electrolyte abnormalities, infectious causes, and signs of inflammation. I also want to get a panel that looks at your antibody count. I have ordered imaging of your face and your brain as well. Iron deficiency- we will recheck that today. Long term I encourage you to get a colonoscopy to rule out colon cancer causing your low iron.

## 2023-09-13 DIAGNOSIS — R202 Paresthesia of skin: Secondary | ICD-10-CM | POA: Insufficient documentation

## 2023-09-13 NOTE — Progress Notes (Signed)
 Internal Medicine Clinic Attending  Case discussed with the resident physician at the time of the visit.  We reviewed the patient's history, exam, and pertinent patient test results.  I agree with the assessment, diagnosis, and plan of care documented in the resident's note.

## 2023-09-13 NOTE — Assessment & Plan Note (Addendum)
Presents with 2 weeks of numbness to preauricular region on left side. Numbness does not extend to skin but seems to be from ear mid mandible region. She has not noticed difficulty with moving facial muscles or changes in hearing. Also denies rashes or pain close to ear. She denies dental pain A: No masses or enlarged lymph nodes appreciated. Differential for this is broad for paresthesia versus numb chin syndrome. She could have compression of branch of trigeminal nerve or could be more central problem as well.  P: CBC, CMP to evaluate anemia and electrolyte derangements CT maxillofacial for see nodule causing compression MRI w contrast for enhancing lesion, MS? Patient declines HIV testing A1c screening

## 2023-09-13 NOTE — Assessment & Plan Note (Signed)
Lab Results  Component Value Date   WBC 5.9 09/12/2023   HGB 12.7 09/12/2023   HCT 39.7 09/12/2023   MCV 87 09/12/2023   PLT 344 09/12/2023   Never had colonoscopy Still has periods but not heavy No fatigue or shortness of breath. P: Continue Ferrous sulfate 325 mg daily Recheck iron studies  She is not interested in referral to GI for colon cancer screening

## 2023-09-18 LAB — CMP14 + ANION GAP
ALT: 10 [IU]/L (ref 0–32)
AST: 17 [IU]/L (ref 0–40)
Albumin: 3.8 g/dL — ABNORMAL LOW (ref 3.9–4.9)
Alkaline Phosphatase: 79 IU/L (ref 44–121)
Anion Gap: 15 mmol/L (ref 10.0–18.0)
BUN/Creatinine Ratio: 18 (ref 9–23)
BUN: 12 mg/dL (ref 6–24)
Bilirubin Total: 0.4 mg/dL (ref 0.0–1.2)
CO2: 22 mmol/L (ref 20–29)
Calcium: 9.3 mg/dL (ref 8.7–10.2)
Chloride: 104 mmol/L (ref 96–106)
Creatinine, Ser: 0.65 mg/dL (ref 0.57–1.00)
Globulin, Total: 3 g/dL (ref 1.5–4.5)
Glucose: 98 mg/dL (ref 70–99)
Potassium: 3.5 mmol/L (ref 3.5–5.2)
Sodium: 141 mmol/L (ref 134–144)
Total Protein: 6.8 g/dL (ref 6.0–8.5)
eGFR: 111 mL/min/{1.73_m2} (ref >59)

## 2023-09-18 LAB — IRON,TIBC AND FERRITIN PANEL
Ferritin: 35 ng/mL (ref 15–150)
Iron Saturation: 17 % (ref 15–55)
Iron: 51 ug/dL (ref 27–159)
Total Iron Binding Capacity: 299 ug/dL (ref 250–450)
UIBC: 248 ug/dL (ref 131–425)

## 2023-09-18 LAB — MULTIPLE MYELOMA PANEL, SERUM
Albumin SerPl Elph-Mcnc: 3.3 g/dL (ref 2.9–4.4)
Albumin/Glob SerPl: 1 (ref 0.7–1.7)
Alpha 1: 0.2 g/dL (ref 0.0–0.4)
Alpha2 Glob SerPl Elph-Mcnc: 0.7 g/dL (ref 0.4–1.0)
B-Globulin SerPl Elph-Mcnc: 1.1 g/dL (ref 0.7–1.3)
Gamma Glob SerPl Elph-Mcnc: 1.5 g/dL (ref 0.4–1.8)
Globulin, Total: 3.5 g/dL (ref 2.2–3.9)
IgA/Immunoglobulin A, Serum: 219 mg/dL (ref 87–352)
IgG (Immunoglobin G), Serum: 1653 mg/dL — ABNORMAL HIGH (ref 586–1602)
IgM (Immunoglobulin M), Srm: 70 mg/dL (ref 26–217)

## 2023-09-18 LAB — HEMOGLOBIN A1C
Est. average glucose Bld gHb Est-mCnc: 117 mg/dL
Hgb A1c MFr Bld: 5.7 % — ABNORMAL HIGH (ref 4.8–5.6)

## 2023-09-18 LAB — CBC WITH DIFFERENTIAL/PLATELET
Basophils Absolute: 0 10*3/uL (ref 0.0–0.2)
Basos: 0 %
EOS (ABSOLUTE): 0.1 10*3/uL (ref 0.0–0.4)
Eos: 2 %
Hematocrit: 39.7 % (ref 34.0–46.6)
Hemoglobin: 12.7 g/dL (ref 11.1–15.9)
Immature Grans (Abs): 0 10*3/uL (ref 0.0–0.1)
Immature Granulocytes: 0 %
Lymphocytes Absolute: 1.3 10*3/uL (ref 0.7–3.1)
Lymphs: 23 %
MCH: 27.9 pg (ref 26.6–33.0)
MCHC: 32 g/dL (ref 31.5–35.7)
MCV: 87 fL (ref 79–97)
Monocytes Absolute: 0.5 10*3/uL (ref 0.1–0.9)
Monocytes: 9 %
Neutrophils Absolute: 3.9 10*3/uL (ref 1.4–7.0)
Neutrophils: 66 %
Platelets: 344 10*3/uL (ref 150–450)
RBC: 4.55 x10E6/uL (ref 3.77–5.28)
RDW: 13.6 % (ref 11.7–15.4)
WBC: 5.9 10*3/uL (ref 3.4–10.8)

## 2023-09-25 NOTE — Addendum Note (Signed)
Addended by: Lucille Passy on: 09/25/2023 04:56 PM   Modules accepted: Orders

## 2023-10-15 ENCOUNTER — Other Ambulatory Visit: Payer: Self-pay | Admitting: Student

## 2023-11-26 ENCOUNTER — Other Ambulatory Visit: Payer: Self-pay | Admitting: Internal Medicine

## 2024-01-25 ENCOUNTER — Encounter: Payer: Self-pay | Admitting: Internal Medicine

## 2024-03-28 ENCOUNTER — Other Ambulatory Visit: Payer: Self-pay | Admitting: Student

## 2024-04-10 ENCOUNTER — Other Ambulatory Visit: Payer: Self-pay | Admitting: Student

## 2024-04-10 DIAGNOSIS — I10 Essential (primary) hypertension: Secondary | ICD-10-CM

## 2024-05-02 ENCOUNTER — Encounter: Payer: Self-pay | Admitting: *Deleted

## 2024-05-14 ENCOUNTER — Other Ambulatory Visit: Payer: Self-pay | Admitting: Internal Medicine

## 2024-05-14 DIAGNOSIS — Z1231 Encounter for screening mammogram for malignant neoplasm of breast: Secondary | ICD-10-CM

## 2024-06-18 ENCOUNTER — Ambulatory Visit

## 2024-06-25 ENCOUNTER — Ambulatory Visit
Admission: RE | Admit: 2024-06-25 | Discharge: 2024-06-25 | Disposition: A | Discharge status: Home / Self Care | Source: Ambulatory Visit

## 2024-06-25 DIAGNOSIS — Z1231 Encounter for screening mammogram for malignant neoplasm of breast: Secondary | ICD-10-CM

## 2024-07-01 ENCOUNTER — Other Ambulatory Visit: Payer: Self-pay

## 2024-07-02 MED ORDER — FERROUS SULFATE 325 (65 FE) MG PO TABS: 325.0000 mg | ORAL_TABLET | Freq: Every day | ORAL | 0 refills | Status: AC

## 2024-08-11 ENCOUNTER — Other Ambulatory Visit: Payer: Self-pay

## 2024-08-11 DIAGNOSIS — I10 Essential (primary) hypertension: Secondary | ICD-10-CM

## 2024-08-11 NOTE — Telephone Encounter (Signed)
 Patient last seen 09/12/23 I called the patient to schedule a appointment. Unable to reach the patient, I lvm for her to give us  a call back.

## 2024-08-12 MED ORDER — HYDROCHLOROTHIAZIDE 25 MG PO TABS: 25.0000 mg | ORAL_TABLET | Freq: Every day | ORAL | 0 refills | Status: DC

## 2024-09-30 ENCOUNTER — Ambulatory Visit (INDEPENDENT_AMBULATORY_CARE_PROVIDER_SITE_OTHER): Payer: Self-pay | Admitting: Student

## 2024-09-30 VITALS — BP 141/93 | HR 80 | Temp 98.0°F | Ht 66.0 in | Wt 321.6 lb

## 2024-09-30 DIAGNOSIS — R7303 Prediabetes: Secondary | ICD-10-CM | POA: Diagnosis not present

## 2024-09-30 DIAGNOSIS — Z6841 Body Mass Index (BMI) 40.0 and over, adult: Secondary | ICD-10-CM

## 2024-09-30 DIAGNOSIS — E7800 Pure hypercholesterolemia, unspecified: Secondary | ICD-10-CM

## 2024-09-30 DIAGNOSIS — I10 Essential (primary) hypertension: Secondary | ICD-10-CM | POA: Diagnosis not present

## 2024-09-30 DIAGNOSIS — Z79899 Other long term (current) drug therapy: Secondary | ICD-10-CM

## 2024-09-30 DIAGNOSIS — D508 Other iron deficiency anemias: Secondary | ICD-10-CM | POA: Diagnosis not present

## 2024-09-30 DIAGNOSIS — Z833 Family history of diabetes mellitus: Secondary | ICD-10-CM

## 2024-09-30 DIAGNOSIS — Z8249 Family history of ischemic heart disease and other diseases of the circulatory system: Secondary | ICD-10-CM

## 2024-09-30 DIAGNOSIS — Z0000 Encounter for general adult medical examination without abnormal findings: Secondary | ICD-10-CM

## 2024-09-30 MED ORDER — ENALAPRIL MALEATE 20 MG PO TABS: 40.0000 mg | ORAL_TABLET | Freq: Every day | ORAL | 3 refills | Status: AC

## 2024-09-30 MED ORDER — HYDROCHLOROTHIAZIDE 25 MG PO TABS: 25.0000 mg | ORAL_TABLET | Freq: Every day | ORAL | 3 refills | Status: AC

## 2024-09-30 NOTE — Assessment & Plan Note (Signed)
 BP today 141/93. Patient did not take BP meds before visit. Patient does not check BP at home. Patient has access to BP cuff, encouraged patient to start checking BP daily at home. Currently on hydrochlorothiazide  25 mg and enalapril  40 mg daily. Risks of enalapril  discussed, patient has not had any problems while on the medication and reports on being on it since 1999.   Plan:  -continue hydrochlorothiazide  and enalapril  daily  -BMP with GFR at next visit to assess kidney function    Orders:   hydrochlorothiazide  (HYDRODIURIL ) 25 MG tablet; Take 1 tablet (25 mg total) by mouth daily.

## 2024-09-30 NOTE — Progress Notes (Unsigned)
 This is a Psychologist, Occupational Note.  The care of the patient was discussed with Dr. Marylu  and the assessment and plan was formulated with their assistance.  Please see their note for official documentation of the patient encounter.   Subjective:   Patient ID: Deborah Gonzales female   DOB: September 20, 1981 43 y.o.   MRN: [id number]  HPI: Deborah Gonzales is a 43 y.o. female with past medical history of HTN, HLD, and IDA who presents to the clinic for general health follow up. Patient does not have any acute concerns. Patient motivated to continue to lose weight. Has been working out at her apartment gym and is continuing healthy diet.     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 On appropriate therapy with ACE-i   Hyperlipidemia    Hypertension    Left ankle pain 06/26/2016   Pain in both upper arms 09/05/2016   Severe obesity (BMI >= 40) (HCC)    BMI 55   Venous insufficiency of both lower extremities 05/21/2017   Current Outpatient Medications  Medication Sig Dispense Refill   acetaminophen  (TYLENOL ) 500 MG tablet Take 2 tablets (1,000 mg total) by mouth every 6 (six) hours as needed. 30 tablet 0   enalapril  (VASOTEC ) 20 MG tablet Take 2 tablets (40 mg total) by mouth daily. 180 tablet 3   ferrous sulfate  (SV IRON) 325 (65 FE) MG tablet Take 1 tablet (325 mg total) by mouth daily. 90 tablet 0   hydrochlorothiazide  (HYDRODIURIL ) 25 MG tablet Take 1 tablet (25 mg total) by mouth daily. 90 tablet 3   No current facility-administered medications for this visit.   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   Patient is right handed.    Social Drivers of Health   Financial Resource Strain: Patient Declined (09/30/2024)   Overall Financial Resource Strain (CARDIA)    Difficulty of Paying Living Expenses: Patient declined  Food Insecurity: No Food Insecurity (09/30/2024)   Hunger Vital Sign    Worried About Running Out of Food in the Last Year: Never true    Ran Out of Food in the Last Year: Never true  Transportation Needs: No Transportation Needs (09/30/2024)   PRAPARE - Administrator, Civil Service (Medical): No    Lack of Transportation (Non-Medical): No  Physical Activity: Unknown (09/30/2024)   Exercise Vital Sign    Days of Exercise per Week: 5 days    Minutes of Exercise per Session: Patient declined  Stress: No Stress Concern Present (09/30/2024)   [employer] of Occupational Health - Occupational Stress Questionnaire  Feeling of Stress: Not at all  Social Connections: Unknown (09/30/2024)   Social Connection and Isolation Panel    Frequency of Communication with Friends and Family: More than three times a week    Frequency of Social Gatherings with Friends and Family: Twice a week    Attends Religious Services: Patient declined    Database Administrator or Organizations: Patient declined    Attends Banker Meetings: Patient declined    Marital Status: Not on file   Review of Systems: Pertinent items are noted in HPI. Objective:  Physical Exam: Vitals:   09/30/24 1002  BP: (!) 141/93  Pulse: 80  Temp: 98 F (36.7 C)  TempSrc: Oral  SpO2: 100%  Weight: (!) 321 lb  9.6 oz (145.9 kg)  Height: 5' 6 (1.676 m)   BP (!) 141/93 (BP Location: Left Arm, Patient Position: Sitting, Cuff Size: Small)   Pulse 80   Temp 98 F (36.7 C) (Oral)   Ht 5' 6 (1.676 m)   Wt (!) 321 lb 9.6 oz (145.9 kg)   SpO2 100%   BMI 51.91 kg/m   General Appearance:    Alert, cooperative, no distress, appears stated age  Head:    Normocephalic, without obvious abnormality, atraumatic  Lungs:     Clear to auscultation bilaterally, respirations unlabored   Heart:    Regular rate and rhythm, S1 and S2 normal, no murmur, rub   or gallop   Assessment & Plan:   Assessment & Plan Other iron deficiency anemia Patient used to take iron pills but stopped due to reports of myalgias. Last CBC a year ago showed no signs of anemia. CBC pending and will get fecal occult blood to assess for bleeding.   Plan:  -follow up CBC and fecal occult blood   Orders:   Fecal occult blood, imunochemical; Future   CBC no Diff  Essential hypertension BP today 141/93. Patient did not take BP meds before visit. Patient does not check BP at home. Patient has access to BP cuff, encouraged patient to start checking BP daily at home. Currently on hydrochlorothiazide  25 mg and enalapril  40 mg daily. Risks of enalapril  discussed, patient has not had any problems while on the medication and reports on being on it since 1999.   Plan:  -continue hydrochlorothiazide  and enalapril  daily  -BMP with GFR at next visit to assess kidney function    Orders:   hydrochlorothiazide  (HYDRODIURIL ) 25 MG tablet; Take 1 tablet (25 mg total) by mouth daily.  Prediabetes Pending A1c, will follow up.  Orders:   Hemoglobin A1c  Pure hypercholesterolemia Last lipid panel was 1 year ago, LDL 117. Current lipid panel pending and will follow up. Consider starting statin if LDL is still elevated.  Orders:   Lipid Profile  Severe obesity (BMI >= 40) (HCC) Patient motivated to continue weight loss. Set goal of 10-15 pound  weight loss in 3 months, and below 300 pounds in 6 months. Continue to follow up and encourage weight loss.     Health care maintenance Patient has never been sexual active, pap smear not needed at this time. Will follow up on fecal occult blood test for colon cancer screen.      Discussed and evaluated with Dr. Marylu and discussed plan with Dr. Jeanelle.    Cozetta Pereyra, MS3

## 2024-09-30 NOTE — Assessment & Plan Note (Signed)
 Last lipid panel was 1 year ago, LDL 117. Current lipid panel pending and will follow up. Consider starting statin if LDL is still elevated.  Orders:   Lipid Profile

## 2024-09-30 NOTE — Assessment & Plan Note (Signed)
 Patient used to take iron pills but stopped due to reports of myalgias. Last CBC a year ago showed no signs of anemia. CBC pending and will get fecal occult blood to assess for bleeding.   Plan:  -follow up CBC and fecal occult blood   Orders:   Fecal occult blood, imunochemical; Future   CBC no Diff

## 2024-09-30 NOTE — Assessment & Plan Note (Signed)
 Patient has never been sexual active, pap smear not needed at this time. Will follow up on fecal occult blood test for colon cancer screen.

## 2024-09-30 NOTE — Assessment & Plan Note (Signed)
 Patient motivated to continue weight loss. Set goal of 10-15 pound weight loss in 3 months, and below 300 pounds in 6 months. Continue to follow up and encourage weight loss.

## 2024-10-01 LAB — HEMOGLOBIN A1C
Est. average glucose Bld gHb Est-mCnc: 111 mg/dL
Hgb A1c MFr Bld: 5.5 % (ref 4.8–5.6)

## 2024-10-01 LAB — LIPID PANEL
Chol/HDL Ratio: 3.3 ratio (ref 0.0–4.4)
Cholesterol, Total: 182 mg/dL (ref 100–199)
HDL: 55 mg/dL (ref >39)
LDL Chol Calc (NIH): 117 mg/dL — ABNORMAL HIGH (ref 0–99)
Triglycerides: 54 mg/dL (ref 0–149)
VLDL Cholesterol Cal: 10 mg/dL (ref 5–40)

## 2024-10-01 LAB — CBC
Hematocrit: 37.4 % (ref 34.0–46.6)
Hemoglobin: 11.9 g/dL (ref 11.1–15.9)
MCH: 27.3 pg (ref 26.6–33.0)
MCHC: 31.8 g/dL (ref 31.5–35.7)
MCV: 86 fL (ref 79–97)
Platelets: 405 x10E3/uL (ref 150–450)
RBC: 4.36 x10E6/uL (ref 3.77–5.28)
RDW: 13.5 % (ref 11.7–15.4)
WBC: 4.1 x10E3/uL (ref 3.4–10.8)

## 2024-10-01 NOTE — Progress Notes (Signed)
 Internal Medicine Clinic Attending  Case discussed with the resident at the time of the visit.  We reviewed the resident's history and exam and pertinent patient test results.  I agree with the assessment, diagnosis, and plan of care documented in the resident's note.

## 2024-10-02 ENCOUNTER — Other Ambulatory Visit: Payer: Self-pay | Admitting: Student

## 2024-10-02 ENCOUNTER — Ambulatory Visit: Payer: Self-pay | Admitting: Student

## 2024-10-02 MED ORDER — ROSUVASTATIN CALCIUM 5 MG PO TABS: 5.0000 mg | ORAL_TABLET | Freq: Every day | ORAL | 0 refills | Status: AC

## 2024-12-31 ENCOUNTER — Ambulatory Visit
